# Patient Record
Sex: Female | Born: 1980 | Hispanic: Yes | Marital: Married | Smoking: Never smoker
Health system: Southern US, Community
[De-identification: ages and names within clinical notes are randomized; demographics above are authoritative.]

## PROBLEM LIST (undated history)

## (undated) DIAGNOSIS — I10 Essential (primary) hypertension: Secondary | ICD-10-CM

## (undated) DIAGNOSIS — F329 Major depressive disorder, single episode, unspecified: Secondary | ICD-10-CM

## (undated) DIAGNOSIS — F32A Depression, unspecified: Secondary | ICD-10-CM

## (undated) DIAGNOSIS — M329 Systemic lupus erythematosus, unspecified: Secondary | ICD-10-CM

## (undated) HISTORY — DX: Depression, unspecified: F32.A

## (undated) HISTORY — DX: Systemic lupus erythematosus, unspecified: M32.9

## (undated) HISTORY — DX: Major depressive disorder, single episode, unspecified: F32.9

---

## 2002-03-24 LAB — US OB DETAIL + 14 WK

## 2004-11-01 ENCOUNTER — Emergency Department (HOSPITAL_COMMUNITY): Admission: EM | Admit: 2004-11-01 | Discharge: 2004-11-01 | Payer: Self-pay | Admitting: Emergency Medicine

## 2005-04-29 ENCOUNTER — Ambulatory Visit (HOSPITAL_COMMUNITY): Admission: RE | Admit: 2005-04-29 | Discharge: 2005-04-29 | Payer: Self-pay | Admitting: Gastroenterology

## 2005-05-04 HISTORY — PX: CHOLECYSTECTOMY: SHX55

## 2005-06-03 ENCOUNTER — Ambulatory Visit (HOSPITAL_COMMUNITY): Admission: RE | Admit: 2005-06-03 | Discharge: 2005-06-03 | Payer: Self-pay | Admitting: Surgery

## 2010-05-24 ENCOUNTER — Encounter: Payer: Self-pay | Admitting: Gastroenterology

## 2011-10-07 ENCOUNTER — Ambulatory Visit (INDEPENDENT_AMBULATORY_CARE_PROVIDER_SITE_OTHER): Payer: Self-pay | Admitting: Internal Medicine

## 2011-10-07 ENCOUNTER — Encounter: Payer: Self-pay | Admitting: Internal Medicine

## 2011-10-07 VITALS — BP 122/80 | HR 74 | Temp 98.5°F | Ht 60.0 in | Wt 126.0 lb

## 2011-10-07 DIAGNOSIS — B9689 Other specified bacterial agents as the cause of diseases classified elsewhere: Secondary | ICD-10-CM

## 2011-10-07 DIAGNOSIS — A499 Bacterial infection, unspecified: Secondary | ICD-10-CM

## 2011-10-07 DIAGNOSIS — N760 Acute vaginitis: Secondary | ICD-10-CM | POA: Insufficient documentation

## 2011-10-07 DIAGNOSIS — F329 Major depressive disorder, single episode, unspecified: Secondary | ICD-10-CM

## 2011-10-07 MED ORDER — METRONIDAZOLE 500 MG PO TABS: 500.0000 mg | ORAL_TABLET | Freq: Two times a day (BID) | ORAL | Status: AC

## 2011-10-07 NOTE — Assessment & Plan Note (Signed)
Was treated 2 years ago when husband deported Severe stress with his infidelity but only occ down days and not anhedonic Rx not indicated at present

## 2011-10-07 NOTE — Progress Notes (Signed)
  Subjective:    Patient ID: Jenny Wagner, female    DOB: May 24, 1980, 31 y.o.   MRN: [id number]  HPI Establishing here  No gynecologist  Has some recurrence of yellowish vaginal discharge Was treated with antibiotic about 1 month ago and it helped Had pelvic exam and was tested for STDs Now having recurrence of symptoms Gets the symptoms before her periods Periods have been normal--no excessive bleeding  Monogamous with husband but he has been unfaithful Always used condoms other than when trying to conceive No STD in past Screened for bacterial vaginosis (sounds like) and treated in pregnancy No dyspareunia  Depression in the past Does have periods of feeling down or sad--- 1 day per week only though Some marital problems which are bad---he did have affair Not anhedonic Was treated with meds about 2 years ago---husband was deported and she had stress then  No current outpatient prescriptions on file prior to visit.    No Known Allergies  Past Medical History  Diagnosis Date  . Depression     Past Surgical History  Procedure Date  . Cholecystectomy 2007    Family History  Problem Relation Age of Onset  . Hypertension Other   . Diabetes Other     History   Social History  . Marital Status: Married    Spouse Name: N/A    Number of Children: 2  . Years of Education: N/A   Occupational History  . Stay at home mom    Social History Main Topics  . Smoking status: Never Smoker   . Smokeless tobacco: Never Used  . Alcohol Use: No  . Drug Use: No  . Sexually Active: Not on file   Other Topics Concern  . Not on file   Social History Narrative  . No narrative on file   Review of Systems  Constitutional: Positive for unexpected weight change. Negative for fatigue.       Has lost weight with stress Wears seat belt  Respiratory: Negative for cough, chest tightness and shortness of breath.   Cardiovascular: Negative for chest pain, palpitations and leg  swelling.  Gastrointestinal: Negative for nausea, vomiting, abdominal pain, blood in stool and anal bleeding.       Rectal urgency with stress---gets loose stools  Musculoskeletal: Negative for back pain and arthralgias.  Neurological: Negative for dizziness, syncope, light-headedness and headaches.  Psychiatric/Behavioral: Positive for dysphoric mood. Negative for suicidal ideas. The patient is not nervous/anxious.        Objective:   Physical Exam  Constitutional: She appears well-developed and well-nourished. No distress.  HENT:  Mouth/Throat: Oropharynx is clear and moist. No oropharyngeal exudate.  Neck: Normal range of motion. Neck supple. No thyromegaly present.  Cardiovascular: Normal rate, regular rhythm, normal heart sounds and intact distal pulses.  Exam reveals no gallop.   No murmur heard. Pulmonary/Chest: Effort normal and breath sounds normal. No respiratory distress. She has no wheezes. She has no rales.  Abdominal: Soft. There is no tenderness.  Musculoskeletal: She exhibits no edema and no tenderness.  Lymphadenopathy:    She has no cervical adenopathy.  Psychiatric: She has a normal mood and affect. Her behavior is normal. Thought content normal.          Assessment & Plan:

## 2011-10-07 NOTE — Patient Instructions (Signed)
Please try a probiotic daily to prevent further problems Please get last 3 years of records from [facility] in [place]

## 2011-10-07 NOTE — Assessment & Plan Note (Signed)
Has fairly classic symptoms Treated last month but recurred Had STD testing then---will get those records Discussed probiotics Will try course of metronidazole again

## 2011-12-02 ENCOUNTER — Ambulatory Visit (INDEPENDENT_AMBULATORY_CARE_PROVIDER_SITE_OTHER): Payer: Self-pay | Admitting: Internal Medicine

## 2011-12-02 ENCOUNTER — Encounter: Payer: Self-pay | Admitting: Internal Medicine

## 2011-12-02 VITALS — BP 118/62 | HR 94 | Temp 98.4°F | Ht 60.0 in | Wt 118.8 lb

## 2011-12-02 DIAGNOSIS — S239XXA Sprain of unspecified parts of thorax, initial encounter: Secondary | ICD-10-CM

## 2011-12-02 DIAGNOSIS — S29019A Strain of muscle and tendon of unspecified wall of thorax, initial encounter: Secondary | ICD-10-CM | POA: Insufficient documentation

## 2011-12-02 DIAGNOSIS — F439 Reaction to severe stress, unspecified: Secondary | ICD-10-CM | POA: Insufficient documentation

## 2011-12-02 DIAGNOSIS — F430 Acute stress reaction: Secondary | ICD-10-CM

## 2011-12-02 NOTE — Assessment & Plan Note (Signed)
Ongoing seperation Having IBS type symptoms---despite weight loss, nothing to suggest IBD Counseled about this

## 2011-12-02 NOTE — Progress Notes (Signed)
  Subjective:    Patient ID: Jenny Wagner, female    DOB: [birth year], 31 y.o.   MRN: [id number]  HPI Having pain in center of back---both sides of spine Feels like "a bruise" Goes back 11 months--since childbirth Was evaluated in past---told due to not exercising Pain radiates to sides and occ downward Fairly constant but more at night No arm or leg weakness Hasn't tried any meds Hasn't tried heat or any local Rx  Stress now--going through seperation Notes that she has to run to bathroom to move bowels after eating.  Very soft but no blood  No current outpatient prescriptions on file prior to visit.    No Known Allergies  Past Medical History  Diagnosis Date  . Depression     Past Surgical History  Procedure Date  . Cholecystectomy 2007    Family History  Problem Relation Age of Onset  . Hypertension Other   . Diabetes Other     History   Social History  . Marital Status: Married    Spouse Name: N/A    Number of Children: 2  . Years of Education: N/A   Occupational History  . Stay at home mom    Social History Main Topics  . Smoking status: Never Smoker   . Smokeless tobacco: Never Used  . Alcohol Use: No  . Drug Use: No  . Sexually Active: Not on file   Other Topics Concern  . Not on file   Social History Narrative  . No narrative on file   Review of Systems Vaginal symptoms are better Has lost some weight--she wasn't trying    Objective:   Physical Exam  Constitutional: She appears well-developed and well-nourished. No distress.  Neck: Normal range of motion. Neck supple.  Pulmonary/Chest: Effort normal and breath sounds normal. No respiratory distress. She has no wheezes. She has no rales.  Musculoskeletal: Normal range of motion.       Mild tenderness in right paraspinal muscles ~T10 No spine tenderness  Neurological:       No weakness in UE  Psychiatric: She has a normal mood and affect. Her behavior is normal. Thought content normal.            Assessment & Plan:

## 2011-12-02 NOTE — Patient Instructions (Signed)
Please try naproxen 220mg  1-2 tabs at bedtime Start a core strengthening regimen

## 2011-12-02 NOTE — Assessment & Plan Note (Signed)
Really just seems to be muscular Discussed aleve at bedtime Exercises---esp for core

## 2012-07-27 ENCOUNTER — Ambulatory Visit (INDEPENDENT_AMBULATORY_CARE_PROVIDER_SITE_OTHER): Payer: Self-pay | Admitting: Internal Medicine

## 2012-07-27 ENCOUNTER — Encounter: Payer: Self-pay | Admitting: Internal Medicine

## 2012-07-27 VITALS — BP 120/80 | HR 63 | Temp 98.2°F | Wt 124.0 lb

## 2012-07-27 DIAGNOSIS — L299 Pruritus, unspecified: Secondary | ICD-10-CM

## 2012-07-27 NOTE — Assessment & Plan Note (Signed)
No follicular infection Not worrisome Discussed trying cream or powder Consider dermatologist

## 2012-07-27 NOTE — Progress Notes (Signed)
  Subjective:    Patient ID: Jenny Wagner, female    DOB: 11-26-1980, 32 y.o.   MRN: [id number]  HPI Has noticed some pain and itching in breasts Tried switching bras and deodorant Now has lump on right breast ---on surface No discharge No rash  No FH of breast cancer No new laundry detergents Hasn't tried any topical rx  No current outpatient prescriptions on file prior to visit.   No current facility-administered medications on file prior to visit.    No Known Allergies  Past Medical History  Diagnosis Date  . Depression     Past Surgical History  Procedure Laterality Date  . Cholecystectomy  2007    Family History  Problem Relation Age of Onset  . Hypertension Other   . Diabetes Other     History   Social History  . Marital Status: Married    Spouse Name: N/A    Number of Children: 2  . Years of Education: N/A   Occupational History  . Stay at home mom    Social History Main Topics  . Smoking status: Never Smoker   . Smokeless tobacco: Never Used  . Alcohol Use: No  . Drug Use: No  . Sexually Active: Not on file   Other Topics Concern  . Not on file   Social History Narrative  . No narrative on file   Review of Systems No fevers Hasn't felt sick    Objective:   Physical Exam  Genitourinary:  Slight cystic changes in breasts No worrisome lesions  Lymphadenopathy:    She has no axillary adenopathy.  Skin:  Follicular prominence on breasts 2 small papules on right breast--- at hair follicles it looks like          Assessment & Plan:

## 2012-09-28 ENCOUNTER — Ambulatory Visit (INDEPENDENT_AMBULATORY_CARE_PROVIDER_SITE_OTHER): Payer: Self-pay | Admitting: Family Medicine

## 2012-09-28 ENCOUNTER — Telehealth: Payer: Self-pay | Admitting: Internal Medicine

## 2012-09-28 ENCOUNTER — Encounter: Payer: Self-pay | Admitting: Family Medicine

## 2012-09-28 VITALS — BP 100/70 | HR 80 | Temp 98.3°F | Wt 125.2 lb

## 2012-09-28 DIAGNOSIS — J029 Acute pharyngitis, unspecified: Secondary | ICD-10-CM

## 2012-09-28 DIAGNOSIS — N898 Other specified noninflammatory disorders of vagina: Secondary | ICD-10-CM | POA: Insufficient documentation

## 2012-09-28 LAB — POCT WET PREP (WET MOUNT)

## 2012-09-28 MED ORDER — FLUCONAZOLE 150 MG PO TABS: 150.0000 mg | ORAL_TABLET | Freq: Once | ORAL | Status: DC

## 2012-09-28 NOTE — Telephone Encounter (Signed)
Patient Information:  Caller Name: Akirah  Phone: [phone]  Patient: Jenny Wagner, Jenny Wagner  Gender: Female  DOB: 09/17/1980  Age: 32 Years  PCP: Tillman Abide [facility])  Pregnant: No  Office Follow Up:  Does the office need to follow up with this patient?: Yes  Instructions For The Office: Appointments are full. Could office call pt back with possible options for appt today.   Symptoms  Reason For Call & Symptoms: Pt is calling for an appt for lower back pain and sore throat.  Back sx started last night/sore throat started this am. Pt is also feeling dysuria; frequency and a feeling that she is not emptying her bladder. The back pain she rates as a 6/10.  Reviewed Health History In EMR: Yes  Reviewed Medications In EMR: Yes  Reviewed Allergies In EMR: Yes  Reviewed Surgeries / Procedures: Yes  Date of Onset of Symptoms: 09/27/2012 OB / GYN:  LMP: 09/22/2012  Guideline(s) Used:  Urination Pain - Female  Disposition Per Guideline:   See Today in Office  Reason For Disposition Reached:   Painful urination AND EITHER frequency or urgency  Advice Given:  N/A  Patient Will Follow Care Advice:  YES

## 2012-09-28 NOTE — Assessment & Plan Note (Signed)
Treat as viral pharyngitis with supportive care as per instructions. Update if sxs persist.

## 2012-09-28 NOTE — Telephone Encounter (Signed)
Called pt and scheduled appt for tomorrow at 1:30

## 2012-09-28 NOTE — Assessment & Plan Note (Addendum)
Faintly positive wet prep - with rare spores.  Treat with diflucan, update if sxs persist. Pt agrees with plan.

## 2012-09-28 NOTE — Progress Notes (Addendum)
  Subjective:    Patient ID: Jenny Wagner, female    DOB: 12-Sep-1980, 32 y.o.   MRN: [id number]  HPI CC: vaginal infection?  3d h/o yeast infection sxs - light yellow discharge, mild pruritis.  Mild nausea this morning. Last yeast infection was 1 yr ago.  Last night started noticing sharp pain in right flank, then this morning started feeling sore throat. + nasal congestion.  For last month, noticing swelling in hands and feet.  Denies fevers/chills, vomiting, dysuria, urgency, frequency, vag bleeding, hematuria, diarrhea, constipation.  No abd pain.  No new rashes.  H/o seasonal allergies. Sister in law recently sick. No smokers at home. No h/o asthma.  LMP 09/22/2012 Denies chances of pregnancy. Currently sexually active with 1 partner in the last year.  Past Medical History  Diagnosis Date  . Depression      Review of Systems Per HPI    Objective:   Physical Exam  Nursing note and vitals reviewed. Constitutional: She appears well-developed and well-nourished. No distress.  HENT:  Head: Normocephalic and atraumatic.  Right Ear: Hearing, tympanic membrane, external ear and ear canal normal.  Left Ear: Hearing, tympanic membrane, external ear and ear canal normal.  Nose: No mucosal edema or rhinorrhea. Right sinus exhibits no maxillary sinus tenderness and no frontal sinus tenderness. Left sinus exhibits no maxillary sinus tenderness and no frontal sinus tenderness.  Mouth/Throat: Uvula is midline, oropharynx is clear and moist and mucous membranes are normal. No oropharyngeal exudate.  Eyes: Conjunctivae and EOM are normal. Pupils are equal, round, and reactive to light. No scleral icterus.  Neck: Normal range of motion. Neck supple.  Cardiovascular: Normal rate, regular rhythm, normal heart sounds and intact distal pulses.   No murmur heard. Pulmonary/Chest: Effort normal and breath sounds normal. No respiratory distress. She has no wheezes. She has no rales.   Abdominal: Soft. Bowel sounds are normal. She exhibits no distension and no mass. There is no hepatosplenomegaly. There is no tenderness. There is no rigidity, no rebound, no guarding, no CVA tenderness and negative Murphy's sign.  Genitourinary:  Specimen self-collected for wet prep  Musculoskeletal: She exhibits no edema.  No arm or leg swelling noted today.  Lymphadenopathy:    She has no cervical adenopathy (shotty bilat superficial cervical LAD).  Skin: Skin is warm and dry. No rash noted.       Assessment & Plan:

## 2012-09-28 NOTE — Patient Instructions (Signed)
Wet prep overall ok, I want to treat you for yeast infection given symptoms.  Let us know if symptoms persist despite this. For sore throat -  Push fluids and plenty of rest. May use ibuprofen for throat inflammation. Salt water gargles. Suck on cold things like popsicles or warm things like herbal teas (whichever soothes the throat better). Return if fever >101.5, worsening pain, or trouble opening/closing mouth, or hoarse voice. Good to see you today, call clinic with questions.

## 2012-09-28 NOTE — Telephone Encounter (Signed)
Okay Will evaluate then 

## 2012-09-29 ENCOUNTER — Ambulatory Visit: Payer: Self-pay | Admitting: Internal Medicine

## 2012-10-07 ENCOUNTER — Encounter: Payer: Self-pay | Admitting: Internal Medicine

## 2012-10-07 DIAGNOSIS — Z0289 Encounter for other administrative examinations: Secondary | ICD-10-CM

## 2013-01-31 ENCOUNTER — Telehealth: Payer: Self-pay | Admitting: Internal Medicine

## 2013-01-31 NOTE — Telephone Encounter (Signed)
Patient Information:  Caller Name: Rehema  Phone: [phone]  Patient: Jenny, Wagner  Gender: Female  DOB: 22-Mar-1981  Age: 32 Years  PCP: Tillman Abide [facility])  Pregnant: No  Office Follow Up:  Does the office need to follow up with this patient?: No  Instructions For The Office: N/A  RN Note:  Reports hearing is unaffected. Too late for appointment for 01/31/13; Advised to see MD 02/01/13. Reported office staff scheduled appointment for 02/01/13 at 1100 with Dr Alphonsus Sias.  Symptoms  Reason For Call & Symptoms: New onset of "bubbly noise" (congestion) in left ear today without pain.  Nasal congestion or "flu like sympotms" began 01/28/13 with worsening nasal congestion since 01/30/13.  Reviewed Health History In EMR: Yes  Reviewed Medications In EMR: Yes  Reviewed Allergies In EMR: Yes  Reviewed Surgeries / Procedures: Yes  Date of Onset of Symptoms: 01/31/2013 OB / GYN:  LMP: 01/18/2013  Guideline(s) Used:  Ear - Congestion  Disposition Per Guideline:   See Today in Office  Reason For Disposition Reached:   Patient wants to be seen  Advice Given:  Reassurance:  Eustacian tube: There is a small collapsible tube that runs between the middle ear and the nose. Normally, it permits tiny amounts of air to move in and out of the middle ear. When the tube gets blocked, air or fluid can build up behind the ear drum (tympanic membrane). This causes the symptoms of ear congestion.  Here is some care advice that should help.  Causes  Upper respiratory infections (colds) and nasal allergies (hay fever) can block the eustachian tube.  Blowing the nose too hard can also push air and fluid into the eustachian tube.  Air travelers can get ear congestion. This happens because of the changes in air pressure as the air plane takes off and lands.  Treatment - Chewing and Swallowing:   Try chewing gum.  You can also try swallowing water while pinching your nostrils closed. The  reason this works is that it creates a small vacuum in the nose. This helps the eustachian tube to open up.  Treatment - Decongestant Nasal Spray:  If chewing or swallowing doesn't help after 1 or 2 hours, you can try using an over-the-counter (OTC) nasal decongestant drops. You can use the nose drops twice a day.  Oxymetazoline Nasal Drops (e.g., Afrin): Available OTC. Clean out the nose before using. Spray each nostril once, wait one minute for it to absorb, and then spray a second time.  Phenylephrine Nasal Drops (e.g., Neo-Synephrine): Available OTC. Clean out the nose before using. Spray each nostril once, wait one minute for it to absorb, and then spray a second time.  Before taking any medicine, read all the instructions on the package.  Caution - Nasal Decongestants:  Do not take these medications if you are pregnant.  Do not use these medications for more than 3 days (Reason: rebound nasal congestion).  Expected Course:   The symptoms usually get better within 2 days (48 hours) with treatment.  Call Back If:   Ear congestion lasts over 48 hours  Ear pain or fever occurs  You become worse.  RN Overrode Recommendation:  Patient Already Has Appt, Document Patient  Office scheduled appointment for 1100 02/01/13.

## 2013-02-01 ENCOUNTER — Ambulatory Visit: Payer: Self-pay | Admitting: Internal Medicine

## 2013-02-01 NOTE — Telephone Encounter (Signed)
Will see then. 

## 2013-02-20 ENCOUNTER — Ambulatory Visit (INDEPENDENT_AMBULATORY_CARE_PROVIDER_SITE_OTHER): Payer: Self-pay | Admitting: Family Medicine

## 2013-02-20 ENCOUNTER — Telehealth: Payer: Self-pay | Admitting: Internal Medicine

## 2013-02-20 VITALS — BP 120/80 | HR 67 | Ht 60.0 in | Wt 130.0 lb

## 2013-02-20 DIAGNOSIS — S39012A Strain of muscle, fascia and tendon of lower back, initial encounter: Secondary | ICD-10-CM

## 2013-02-20 DIAGNOSIS — S335XXA Sprain of ligaments of lumbar spine, initial encounter: Secondary | ICD-10-CM

## 2013-02-20 MED ORDER — TRAMADOL HCL 50 MG PO TABS: 50.0000 mg | ORAL_TABLET | Freq: Every evening | ORAL | Status: DC | PRN

## 2013-02-20 MED ORDER — MELOXICAM 15 MG PO TABS: 15.0000 mg | ORAL_TABLET | Freq: Every day | ORAL | Status: DC

## 2013-02-20 NOTE — Telephone Encounter (Signed)
Patient Information:  Caller Name: Charlestine  Phone: [phone]  Patient: Jenny Wagner, Jenny Wagner  Gender: Female  DOB: [birth year]  Age: 32 Years  PCP: Tillman Abide [facility])  Pregnant: No  Office Follow Up:  Does the office need to follow up with this patient?: No  Instructions For The Office: N/A  RN Note:  Condoms. Left lower back pain rated 10/10, if moves.  Unable to stand up straight.  Denies numbness or tingling.  No appointments remain at [facility], [place] or [facility].  Scheduled for 1600 02/20/13 at [facility] with Dr Katrinka Blazing.   Symptoms  Reason For Call & Symptoms: Left lower back pain while twisting to remove toddler from tub at 1400. Having difficulty moving or standing up straight due to pain.    Reviewed Health History In EMR: Yes  Reviewed Medications In EMR: Yes  Reviewed Allergies In EMR: Yes  Reviewed Surgeries / Procedures: Yes  Date of Onset of Symptoms: 02/20/2013 OB / GYN:  LMP: 02/09/2013  Guideline(s) Used:  Back Pain  Disposition Per Guideline:   Go to Office Now  Reason For Disposition Reached:   Severe back pain  Advice Given:  Reassurance:  Twisting or heavy lifting can cause back pain.  With treatment, the pain most often goes away in 1-2 weeks.  Cold or Heat:  Cold Pack: For pain or swelling, use a cold pack or ice wrapped in a wet cloth. Put it on the sore area for 20 minutes. Repeat 4 times on the first day, then as needed.  Heat Pack: If pain lasts over 2 days, apply heat to the sore area. Use a heat pack, heating pad, or warm wet washcloth. Do this for 10 minutes, then as needed. For widespread stiffness, take a hot bath or hot shower instead. Move the sore area under the warm water.  Sleep:  Sleep on your side with a pillow between your knees. If you sleep on your back, put a pillow under your knees.  Avoid sleeping on your stomach.  Your mattress should be firm. Avoid waterbeds.  Activity  Keep doing your day-to-day  activities if it is not too painful. Staying active is better than resting.  Avoid anything that makes your pain worse. Avoid heavy lifting, twisting, and too much exercise until your back heals.  You do not need to stay in bed.  Pain Medicines:  Use the lowest amount of medicine that makes your pain feel better.  Ibuprofen (e.g., Motrin, Advil):  Take 400 mg (two 200 mg pills) by mouth every 6 hours.  Another choice is to take 600 mg (three 200 mg pills) by mouth every 8 hours.  The most you should take each day is 1,200 mg (six 200 mg pills), unless your doctor has told you to take more.  Call Back If:  Numbness or weakness occur  Bowel/bladder problems occur  Pain lasts for more than 2 weeks  You become worse.  Good Body Mechanics:  Lifting: Stand close to the object to be lifted. Keep your back straight and lift by bending your legs. Ask for lifting help if needed.  Call Back If:  You become worse.  Patient Will Follow Care Advice:  YES

## 2013-02-20 NOTE — Progress Notes (Signed)
  CC: Low back pain  HPI: Patient is a very pleasant 32 year old female who unfortunately started having pain last night after trying to lift her collar out of the tub. Patient states she had acute pain immediately over the left lower back pain. Patient states it is so that the shoes unable to stand up straight. She denies any radiation of pain, denies any numbness or tingling or any weakness in the lower extremity. Patient states the pain though is 10 out of 10. Patient has not had any injury like this previously. Patient did have thoracic back pain but states that this is a different type of pain. Patient describes the pain as more of a dull aching sensation worse with movement.   Past medical, surgical, family and social history reviewed. Medications reviewed all in the electronic medical record.   Review of Systems: No headache, visual changes, nausea, vomiting, diarrhea, constipation, dizziness, abdominal pain, skin rash, fevers, chills, night sweats, weight loss, swollen lymph nodes, body aches, joint swelling, muscle aches, chest pain, shortness of breath, mood changes.   Objective:    Blood pressure 120/80, pulse 67, height 5' (1.524 m), weight 130 lb (58.968 kg), SpO2 99.00%.   General: No apparent distress alert and oriented x3 mood and affect normal, dressed appropriately.  HEENT: Pupils equal, extraocular movements intact Respiratory: Patient's speak in full sentences and does not appear short of breath Cardiovascular: No lower extremity edema, non tender, no erythema Skin: Warm dry intact with no signs of infection or rash on extremities or on axial skeleton. Abdomen: Soft nontender Neuro: Cranial nerves II through XII are intact, neurovascularly intact in all extremities with 2+ DTRs and 2+ pulses. Lymph: No lymphadenopathy of posterior or anterior cervical chain or axillae bilaterally.  Gait normal with good balance and coordination.  MSK: Non tender with full range of motion and  good stability and symmetric strength and tone of shoulders, elbows, wrist, hip, knee and ankles bilaterally.  Back Exam:  Inspection: Unremarkable  Motion: Flexion 15 deg, Extension 45 deg, Side Bending to 45 deg bilaterally,  Rotation to 45 deg bilaterally  SLR laying: Negative  XSLR laying: Negative  Palpable tenderness: left lumbar paraspinal. FABER: mild positive.  Sensory change: Gross sensation intact to all lumbar and sacral dermatomes.  Reflexes: 2+ at both patellar tendons, 2+ at achilles tendons, Babinski's downgoing.  Strength at foot  Plantar-flexion: 5/5 Dorsi-flexion: 5/5 Eversion: 5/5 Inversion: 5/5  Leg strength  Quad: 5/5 Hamstring: 5/5 Hip flexor: 5/5 Hip abductors: 5/5  Gait careful. .   Impression and Recommendations:     This case required medical decision making of moderate complexity.

## 2013-02-20 NOTE — Patient Instructions (Addendum)
Very nice to meet you Try exercises starting in 48 hours.  Ice 20 minutes 2 times a day.  Meloxicam daily for 7 days  Tramadol at night if needed Come back in  1 week to make sure you are better.   Sacroiliac Joint Mobilization and Rehab 1. Work on pretzel stretching, shoulder back and leg draped in front. 3-5 sets, 30 sec.. 2. hip abductor rotations. standing, hip flexion and rotation outward then inward. 3 sets, 15 reps. when can do comfortably, add ankle weights starting at 2 pounds.  3. cross over stretching - shoulder back to ground, same side leg crossover. 3-5 sets for 30 min..  4. rolling up and back knees to chest and rocking. 5. sacral tilt - 5 sets, hold for 5-10 seconds

## 2013-02-21 ENCOUNTER — Encounter: Payer: Self-pay | Admitting: Family Medicine

## 2013-02-21 DIAGNOSIS — S39012A Strain of muscle, fascia and tendon of lower back, initial encounter: Secondary | ICD-10-CM | POA: Insufficient documentation

## 2013-02-21 MED ORDER — KETOROLAC TROMETHAMINE 60 MG/2ML IM SOLN
60.0000 mg | Freq: Once | INTRAMUSCULAR | Status: AC
  Administered 2013-02-20: 60 mg via INTRAMUSCULAR

## 2013-02-21 MED ORDER — METHYLPREDNISOLONE ACETATE 80 MG/ML IJ SUSP
80.0000 mg | Freq: Once | INTRAMUSCULAR | Status: AC
  Administered 2013-02-21: 80 mg via INTRAMUSCULAR

## 2013-02-21 NOTE — Addendum Note (Signed)
Addended by: Edwena Felty T on: 02/21/2013 07:58 AM   Modules accepted: Orders

## 2013-02-21 NOTE — Assessment & Plan Note (Signed)
Diagnosis, prognosis and rehabilitation discussed. No signs of radiculopathy with negative straight leg test. Handout given for home exercises Toradol and Depo-Medrol given today. Meloxicam and tramadol given for home use. Discussed icing protocol Patient will return in one week for further evaluation.

## 2013-03-01 ENCOUNTER — Ambulatory Visit: Payer: Self-pay | Admitting: Family Medicine

## 2013-03-01 DIAGNOSIS — Z0289 Encounter for other administrative examinations: Secondary | ICD-10-CM

## 2013-06-06 ENCOUNTER — Encounter: Payer: Self-pay | Admitting: Family Medicine

## 2013-06-06 ENCOUNTER — Ambulatory Visit (INDEPENDENT_AMBULATORY_CARE_PROVIDER_SITE_OTHER): Payer: Self-pay | Admitting: Family Medicine

## 2013-06-06 VITALS — BP 122/80 | HR 94 | Temp 97.7°F | Wt 130.2 lb

## 2013-06-06 DIAGNOSIS — J069 Acute upper respiratory infection, unspecified: Secondary | ICD-10-CM | POA: Insufficient documentation

## 2013-06-06 DIAGNOSIS — B9789 Other viral agents as the cause of diseases classified elsewhere: Principal | ICD-10-CM

## 2013-06-06 LAB — POCT INFLUENZA A/B
Influenza A, POC: NEGATIVE
Influenza B, POC: NEGATIVE

## 2013-06-06 MED ORDER — GUAIFENESIN-CODEINE 100-10 MG/5ML PO SYRP: 5.0000 mL | ORAL_SOLUTION | Freq: Two times a day (BID) | ORAL | Status: DC | PRN

## 2013-06-06 NOTE — Assessment & Plan Note (Addendum)
Given body aches check flu swab - negative Lungs clear.  Anticipate viral given short duration. Treat supportively as per pt instructions cheratussin cough syrup for cough at night time. Update if sxs deteriorate or fail to improve as expected. Mild R eye conjunctivitis anticipate viral - treat with cool compresses and lubricating eye drops.

## 2013-06-06 NOTE — Addendum Note (Signed)
Addended by: Royann Shivers A on: 06/06/2013 03:36 PM   Modules accepted: Orders

## 2013-06-06 NOTE — Progress Notes (Signed)
Pre-visit discussion using our clinic review tool. No additional management support is needed unless otherwise documented below in the visit note.  

## 2013-06-06 NOTE — Progress Notes (Addendum)
   Subjective:    Patient ID: Jenny Wagner, female    DOB: 12/08/1980, 33 y.o.   MRN: [id number]  HPI CC: chest congestion   3d h/o body aches, dry cough, chest congestion.  Started with rhinorrhea.  Feverish last night.  Aches in jaw.  2-3 wks ago with redness in right eye.  +PNdrianage and HA.  Cough worse at night time.  No abd pain, ear pain, nausea, ST, wheezing.   + sick contacts recently as well as at church. Did not receive flu shot. No h/o asthma. Hasn't tried anything for this yet.  Past Medical History  Diagnosis Date  . Depression      Review of Systems Per HPI    Objective:   Physical Exam  Nursing note and vitals reviewed. Constitutional: She appears well-developed and well-nourished. No distress.  HENT:  Head: Normocephalic and atraumatic.  Right Ear: Hearing, tympanic membrane, external ear and ear canal normal.  Left Ear: Hearing, tympanic membrane, external ear and ear canal normal.  Nose: Mucosal edema present. No rhinorrhea. Right sinus exhibits no maxillary sinus tenderness and no frontal sinus tenderness. Left sinus exhibits no maxillary sinus tenderness and no frontal sinus tenderness.  Mouth/Throat: Uvula is midline and mucous membranes are normal. Posterior oropharyngeal erythema (mild) present. No oropharyngeal exudate, posterior oropharyngeal edema or tonsillar abscesses.  Eyes: Conjunctivae and EOM are normal. Pupils are equal, round, and reactive to light. No scleral icterus.  Slight R bulbar conjunctival injection with limbic sparing, EOMI without pain.  Neck: Normal range of motion. Neck supple.  Cardiovascular: Normal rate, regular rhythm, normal heart sounds and intact distal pulses.   No murmur heard. Pulmonary/Chest: Effort normal and breath sounds normal. No respiratory distress. She has no wheezes. She has no rales.  Lymphadenopathy:    She has no cervical adenopathy.  Skin: Skin is warm and dry. No rash noted.       Assessment & Plan:

## 2013-06-06 NOTE — Patient Instructions (Signed)
You have a viral upper respiratory infection. Antibiotics are not needed for this.  Viral infections usually take 7-10 days to resolve.  The cough can last a few weeks to go away. Use medication as prescribed: cheratussin as needed Push fluids and plenty of rest. Please return if you are not improving as expected, or let us know if you have high fevers (>101.5) or worsening productive cough. Call clinic with questions.  Good to see you today.

## 2013-12-06 ENCOUNTER — Encounter: Payer: Self-pay | Admitting: Internal Medicine

## 2013-12-06 ENCOUNTER — Ambulatory Visit (INDEPENDENT_AMBULATORY_CARE_PROVIDER_SITE_OTHER): Payer: Self-pay | Admitting: Internal Medicine

## 2013-12-06 VITALS — BP 110/70 | HR 73 | Temp 98.5°F | Wt 131.0 lb

## 2013-12-06 DIAGNOSIS — J029 Acute pharyngitis, unspecified: Secondary | ICD-10-CM

## 2013-12-06 MED ORDER — KETOCONAZOLE 2 % EX SHAM: 1.0000 "application " | MEDICATED_SHAMPOO | CUTANEOUS | Status: DC

## 2013-12-06 NOTE — Progress Notes (Signed)
Pre visit review using our clinic review tool, if applicable. No additional management support is needed unless otherwise documented below in the visit note. 

## 2013-12-06 NOTE — Progress Notes (Signed)
   Subjective:    Patient ID: Jenny Wagner, female    DOB: 08-Nov-1980, 33 y.o.   MRN: [id number]  HPI Having sore throat-- started ~4 days ago Some pain in ears and then radiates down to throat Pain with swallowing---but still able to eat  No fever Some cough---mostly at night. Feels PND Some headache--temporal Slight SOB---relates it to swollen throat  Used some promethazine DM last night---helped her sleep Son is sick now also  No current outpatient prescriptions on file prior to visit.   No current facility-administered medications on file prior to visit.    No Known Allergies  Past Medical History  Diagnosis Date  . Depression     Past Surgical History  Procedure Laterality Date  . Cholecystectomy  2007    Family History  Problem Relation Age of Onset  . Hypertension Other   . Diabetes Other     History   Social History  . Marital Status: Married    Spouse Name: N/A    Number of Children: 2  . Years of Education: N/A   Occupational History  . Stay at home mom    Social History Main Topics  . Smoking status: Never Smoker   . Smokeless tobacco: Never Used  . Alcohol Use: No  . Drug Use: No  . Sexual Activity: Not on file   Other Topics Concern  . Not on file   Social History Narrative  . No narrative on file   Review of Systems No rash Mild nausea due to throat--like when brushing teeth No vomiting or diarrhea    Objective:   Physical Exam  Constitutional: She appears well-developed and well-nourished. No distress.  HENT:  No sinus tenderness Pharynx slightly red--but no tonsillar enlargement TMs normal  Neck: Normal range of motion. Neck supple.  Slight non tender anterior cervical nodes  Pulmonary/Chest: Effort normal and breath sounds normal. No respiratory distress. She has no wheezes. She has no rales.  Skin: No rash noted.          Assessment & Plan:

## 2013-12-06 NOTE — Assessment & Plan Note (Signed)
Clearly seems to be viral Discussed symptomatic Rx If worsens next week, she should call (?secondary sinusitis possible)

## 2013-12-11 ENCOUNTER — Telehealth: Payer: Self-pay | Admitting: Internal Medicine

## 2013-12-11 MED ORDER — AMOXICILLIN 500 MG PO TABS: 1000.0000 mg | ORAL_TABLET | Freq: Two times a day (BID) | ORAL | Status: DC

## 2013-12-11 NOTE — Telephone Encounter (Signed)
Let her know that I sent a prescription for the antibiotic

## 2013-12-11 NOTE — Telephone Encounter (Signed)
Pt not feeling any better. Has really deep cough with yellow/green phlegm, runny nose and both ears are hurting. Really bad sore throat. [facility].  [phone]

## 2013-12-11 NOTE — Telephone Encounter (Signed)
Spoke with patient and advised results   

## 2013-12-13 ENCOUNTER — Encounter: Payer: Self-pay | Admitting: Internal Medicine

## 2013-12-13 ENCOUNTER — Ambulatory Visit (INDEPENDENT_AMBULATORY_CARE_PROVIDER_SITE_OTHER): Payer: Self-pay | Admitting: Internal Medicine

## 2013-12-13 ENCOUNTER — Ambulatory Visit (INDEPENDENT_AMBULATORY_CARE_PROVIDER_SITE_OTHER)
Admission: RE | Admit: 2013-12-13 | Discharge: 2013-12-13 | Disposition: A | Discharge status: Home / Self Care | Payer: Self-pay | Source: Ambulatory Visit | Attending: Internal Medicine | Admitting: Internal Medicine

## 2013-12-13 VITALS — BP 100/70 | HR 73 | Temp 98.1°F | Resp 12 | Wt 132.0 lb

## 2013-12-13 DIAGNOSIS — R059 Cough, unspecified: Secondary | ICD-10-CM | POA: Insufficient documentation

## 2013-12-13 DIAGNOSIS — R05 Cough: Secondary | ICD-10-CM

## 2013-12-13 DIAGNOSIS — R053 Chronic cough: Secondary | ICD-10-CM | POA: Insufficient documentation

## 2013-12-13 MED ORDER — LEVOFLOXACIN 500 MG PO TABS: 500.0000 mg | ORAL_TABLET | Freq: Every day | ORAL | Status: DC

## 2013-12-13 NOTE — Progress Notes (Signed)
   Subjective:    Patient ID: Jenny Wagner, female    DOB: 1980-06-09, 33 y.o.   MRN: [id number]  HPI Has been getting worse Started the amoxicillin 2 days ago but no better Having pain in back with cough-- and pleuritic left back pain Purulent sputum still Feels SOB when lying down Fever yesterday  nyquil last night--helped her sleep  Current Outpatient Prescriptions on File Prior to Visit  Medication Sig Dispense Refill  . amoxicillin (AMOXIL) 500 MG tablet Take 2 tablets (1,000 mg total) by mouth 2 (two) times daily.  40 tablet  0  . ketoconazole (NIZORAL) 2 % shampoo Apply 1 application topically 2 (two) times a week.  120 mL  5   No current facility-administered medications on file prior to visit.    No Known Allergies  Past Medical History  Diagnosis Date  . Depression     Past Surgical History  Procedure Laterality Date  . Cholecystectomy  2007    Family History  Problem Relation Age of Onset  . Hypertension Other   . Diabetes Other     History   Social History  . Marital Status: Married    Spouse Name: N/A    Number of Children: 2  . Years of Education: N/A   Occupational History  . Stay at home mom    Social History Main Topics  . Smoking status: Never Smoker   . Smokeless tobacco: Never Used  . Alcohol Use: No  . Drug Use: No  . Sexual Activity: Not on file   Other Topics Concern  . Not on file   Social History Narrative  . No narrative on file   Review of Systems No abdominal pain No rash No vomiting or diarrhea Appetite is okay     Objective:   Physical Exam  Constitutional: She appears well-developed and well-nourished. No distress.  Persistent coarse cough  HENT:  Mouth/Throat: Oropharynx is clear and moist. No oropharyngeal exudate.  No sinus tenderness TMs normal  Neck: Normal range of motion. Neck supple. No thyromegaly present.  Pulmonary/Chest: Effort normal and breath sounds normal. No respiratory distress. She has no  wheezes. She has no rales.  Lymphadenopathy:    She has no cervical adenopathy.  Skin: No rash noted.          Assessment & Plan:

## 2013-12-13 NOTE — Progress Notes (Signed)
Pre visit review using our clinic review tool, if applicable. No additional management support is needed unless otherwise documented below in the visit note. 

## 2013-12-13 NOTE — Assessment & Plan Note (Addendum)
With purulent sputum Doesn't seem sinus CXR looks normal Will change to levaquin

## 2013-12-27 ENCOUNTER — Encounter: Payer: Self-pay | Admitting: *Deleted

## 2014-01-24 ENCOUNTER — Other Ambulatory Visit: Payer: Self-pay | Admitting: Internal Medicine

## 2014-01-24 ENCOUNTER — Ambulatory Visit (INDEPENDENT_AMBULATORY_CARE_PROVIDER_SITE_OTHER)
Admission: RE | Admit: 2014-01-24 | Discharge: 2014-01-24 | Disposition: A | Discharge status: Home / Self Care | Payer: Self-pay | Source: Ambulatory Visit | Attending: Internal Medicine | Admitting: Internal Medicine

## 2014-01-24 DIAGNOSIS — J189 Pneumonia, unspecified organism: Secondary | ICD-10-CM

## 2014-01-24 DIAGNOSIS — J181 Lobar pneumonia, unspecified organism: Principal | ICD-10-CM

## 2014-10-02 ENCOUNTER — Encounter: Payer: Self-pay | Admitting: Internal Medicine

## 2014-10-02 ENCOUNTER — Ambulatory Visit (INDEPENDENT_AMBULATORY_CARE_PROVIDER_SITE_OTHER): Payer: Self-pay | Admitting: Internal Medicine

## 2014-10-02 ENCOUNTER — Ambulatory Visit (INDEPENDENT_AMBULATORY_CARE_PROVIDER_SITE_OTHER)
Admission: RE | Admit: 2014-10-02 | Discharge: 2014-10-02 | Disposition: A | Discharge status: Home / Self Care | Payer: Self-pay | Source: Ambulatory Visit | Attending: Internal Medicine | Admitting: Internal Medicine

## 2014-10-02 VITALS — BP 118/70 | HR 80 | Temp 97.7°F | Wt 136.0 lb

## 2014-10-02 DIAGNOSIS — R05 Cough: Secondary | ICD-10-CM

## 2014-10-02 DIAGNOSIS — R059 Cough, unspecified: Secondary | ICD-10-CM

## 2014-10-02 MED ORDER — LEVOFLOXACIN 500 MG PO TABS: 500.0000 mg | ORAL_TABLET | Freq: Every day | ORAL | Status: DC

## 2014-10-02 NOTE — Assessment & Plan Note (Addendum)
Sick for about a week Feels like when she had pneumonia CXR looks okay now Will give 7 day levaquin course though due to her recurrent issues If there is an abnormality on CXR, would refer to pulmonary for further evaluation

## 2014-10-02 NOTE — Progress Notes (Signed)
Pre visit review using our clinic review tool, if applicable. No additional management support is needed unless otherwise documented below in the visit note. 

## 2014-10-02 NOTE — Progress Notes (Signed)
   Subjective:    Patient ID: Jenny Wagner, female    DOB: 1980/11/26, 34 y.o.   MRN: [id number]  HPI Here due to cough Started with sore throat and then cough started the next day Sick for a week  Fever 2 days ago Slight SOB--even just sitting down Some yellow mucus--especially dark in AM  Some ear pain--both sides Has head congestion and headache (with the fever) Feels like she did last year with the pneumonia  Tried zyrtec for the past 3-4 days Tylenol cold and flu helped her rest  No current outpatient prescriptions on file prior to visit.   No current facility-administered medications on file prior to visit.    No Known Allergies  Past Medical History  Diagnosis Date  . Depression     Past Surgical History  Procedure Laterality Date  . Cholecystectomy  2007    Family History  Problem Relation Age of Onset  . Hypertension Other   . Diabetes Other     History   Social History  . Marital Status: Married    Spouse Name: N/A  . Number of Children: 2  . Years of Education: N/A   Occupational History  . Stay at home mom    Social History Main Topics  . Smoking status: Never Smoker   . Smokeless tobacco: Never Used  . Alcohol Use: No  . Drug Use: No  . Sexual Activity: Not on file   Other Topics Concern  . Not on file   Social History Narrative   Review of Systems  No rash No vomiting or diarrhea Appetite is okay Dizziness when lying down or getting up Having some low back pain with her periods for the past 7 months or so    Objective:   Physical Exam  Constitutional: She appears well-developed and well-nourished. No distress.  HENT:  Mouth/Throat: Oropharynx is clear and moist. No oropharyngeal exudate.  No sinus tenderness TMs normal Mild nasal inflammation  Neck: Normal range of motion. Neck supple. No thyromegaly present.  Pulmonary/Chest: Effort normal and breath sounds normal. No respiratory distress. She has no wheezes. She has no  rales.  No dullness  Lymphadenopathy:    She has no cervical adenopathy.          Assessment & Plan:

## 2014-10-03 ENCOUNTER — Encounter: Payer: Self-pay | Admitting: *Deleted

## 2015-01-28 ENCOUNTER — Ambulatory Visit (INDEPENDENT_AMBULATORY_CARE_PROVIDER_SITE_OTHER): Payer: Self-pay | Admitting: Internal Medicine

## 2015-01-28 ENCOUNTER — Encounter: Payer: Self-pay | Admitting: Internal Medicine

## 2015-01-28 VITALS — BP 100/70 | HR 66 | Temp 98.4°F | Wt 139.0 lb

## 2015-01-28 DIAGNOSIS — L219 Seborrheic dermatitis, unspecified: Secondary | ICD-10-CM

## 2015-01-28 MED ORDER — HYDROCORTISONE 2.5 % EX CREA: TOPICAL_CREAM | Freq: Three times a day (TID) | CUTANEOUS | Status: DC | PRN

## 2015-01-28 NOTE — Progress Notes (Signed)
   Subjective:    Patient ID: Jenny Wagner, female    DOB: 02/28/1981, 34 y.o.   MRN: [id number]  HPI Here due to skin lesions  Having "bubbles" like on eye lids Now getting more on side of face First noticed 2-3 months ago  No Rx for this No moisturizers No change in her products Does have dandruff--better wit new shampoo ([employer] Should[employer])  Occasional itching Dry skin on eyelids also  Also some dark spots on skin  No current outpatient prescriptions on file prior to visit.   No current facility-administered medications on file prior to visit.    No Known Allergies  Past Medical History  Diagnosis Date  . Depression     Past Surgical History  Procedure Laterality Date  . Cholecystectomy  2007    Family History  Problem Relation Age of Onset  . Hypertension Other   . Diabetes Other     Social History   Social History  . Marital Status: Married    Spouse Name: N/A  . Number of Children: 2  . Years of Education: N/A   Occupational History  . Stay at home mom    Social History Main Topics  . Smoking status: Never Smoker   . Smokeless tobacco: Never Used  . Alcohol Use: No  . Drug Use: No  . Sexual Activity: Not on file   Other Topics Concern  . Not on file   Social History Narrative   Review of Systems No fever Not sick  Still with some cough--no real change. Zyrtec did help this year--still some mucus    Objective:   Physical Exam  Skin:  Skin seems oily on forehead Has small papules on lid and along nose  Tiny benign nevi on arms Very small seb keratosis on left leg Tiny hyperpigmented spots on lips (advised dermatologist if enlargens)          Assessment & Plan:

## 2015-01-28 NOTE — Progress Notes (Signed)
Pre visit review using our clinic review tool, if applicable. No additional management support is needed unless otherwise documented below in the visit note. 

## 2015-01-28 NOTE — Assessment & Plan Note (Signed)
Scalp clear on exam but has mild T-distribution findings on face Will try 2.5% hydrocort cream To derm if persists or if hyperpigmented areas in lips get bigger

## 2015-07-06 ENCOUNTER — Emergency Department (HOSPITAL_COMMUNITY)
Admission: EM | Admit: 2015-07-06 | Discharge: 2015-07-06 | Disposition: A | Discharge status: Home / Self Care | Payer: Self-pay | Attending: Emergency Medicine | Admitting: Emergency Medicine

## 2015-07-06 ENCOUNTER — Encounter (HOSPITAL_COMMUNITY): Payer: Self-pay | Admitting: *Deleted

## 2015-07-06 DIAGNOSIS — R109 Unspecified abdominal pain: Secondary | ICD-10-CM

## 2015-07-06 DIAGNOSIS — R112 Nausea with vomiting, unspecified: Secondary | ICD-10-CM

## 2015-07-06 DIAGNOSIS — A084 Viral intestinal infection, unspecified: Secondary | ICD-10-CM | POA: Insufficient documentation

## 2015-07-06 DIAGNOSIS — Z3202 Encounter for pregnancy test, result negative: Secondary | ICD-10-CM | POA: Insufficient documentation

## 2015-07-06 DIAGNOSIS — Z8659 Personal history of other mental and behavioral disorders: Secondary | ICD-10-CM | POA: Insufficient documentation

## 2015-07-06 DIAGNOSIS — Z79899 Other long term (current) drug therapy: Secondary | ICD-10-CM | POA: Insufficient documentation

## 2015-07-06 LAB — COMPREHENSIVE METABOLIC PANEL
ALK PHOS: 65 U/L (ref 38–126)
ALT: 50 U/L (ref 14–54)
ANION GAP: 10 (ref 5–15)
AST: 37 U/L (ref 15–41)
Albumin: 4.4 g/dL (ref 3.5–5.0)
BILIRUBIN TOTAL: 1 mg/dL (ref 0.3–1.2)
BUN: 15 mg/dL (ref 6–20)
CALCIUM: 9.2 mg/dL (ref 8.9–10.3)
CO2: 22 mmol/L (ref 22–32)
Chloride: 104 mmol/L (ref 101–111)
Creatinine, Ser: 0.65 mg/dL (ref 0.44–1.00)
GFR calc Af Amer: >60 mL/min (ref >60)
GLUCOSE: 117 mg/dL — AB (ref 65–99)
POTASSIUM: 3.8 mmol/L (ref 3.5–5.1)
Sodium: 136 mmol/L (ref 135–145)
TOTAL PROTEIN: 7.6 g/dL (ref 6.5–8.1)

## 2015-07-06 LAB — CBC WITH DIFFERENTIAL/PLATELET
Basophils Absolute: 0 10*3/uL (ref 0.0–0.1)
Basophils Relative: 0 %
Eosinophils Absolute: 0 10*3/uL (ref 0.0–0.7)
Eosinophils Relative: 0 %
HEMATOCRIT: 40.7 % (ref 36.0–46.0)
HEMOGLOBIN: 13.5 g/dL (ref 12.0–15.0)
LYMPHS ABS: 0.8 10*3/uL (ref 0.7–4.0)
LYMPHS PCT: 17 %
MCH: 29.6 pg (ref 26.0–34.0)
MCHC: 33.2 g/dL (ref 30.0–36.0)
MCV: 89.3 fL (ref 78.0–100.0)
MONO ABS: 0.3 10*3/uL (ref 0.1–1.0)
MONOS PCT: 7 %
NEUTROS ABS: 3.4 10*3/uL (ref 1.7–7.7)
NEUTROS PCT: 76 %
Platelets: 191 10*3/uL (ref 150–400)
RBC: 4.56 MIL/uL (ref 3.87–5.11)
RDW: 13.2 % (ref 11.5–15.5)
WBC: 4.5 10*3/uL (ref 4.0–10.5)

## 2015-07-06 LAB — I-STAT BETA HCG BLOOD, ED (MC, WL, AP ONLY)

## 2015-07-06 LAB — LIPASE, BLOOD: LIPASE: 24 U/L (ref 11–51)

## 2015-07-06 MED ORDER — ONDANSETRON HCL 4 MG/2ML IJ SOLN
4.0000 mg | Freq: Once | INTRAMUSCULAR | Status: AC
  Administered 2015-07-06: 4 mg via INTRAVENOUS
  Filled 2015-07-06: qty 2

## 2015-07-06 MED ORDER — HYDROCODONE-ACETAMINOPHEN 5-325 MG PO TABS: 2.0000 | ORAL_TABLET | ORAL | Status: DC | PRN

## 2015-07-06 MED ORDER — ONDANSETRON HCL 4 MG PO TABS: 4.0000 mg | ORAL_TABLET | Freq: Four times a day (QID) | ORAL | Status: DC

## 2015-07-06 MED ORDER — HYDROMORPHONE HCL 1 MG/ML IJ SOLN
1.0000 mg | Freq: Once | INTRAMUSCULAR | Status: AC
Start: 2015-07-06 — End: 2015-07-06
  Administered 2015-07-06: 0.5 mg via INTRAVENOUS
  Filled 2015-07-06: qty 1

## 2015-07-06 NOTE — ED Notes (Signed)
PT states for 1 week she has had a sinus infection, given abx yesterday for the same. Last night she started having abdominal pain, n/v, body aches and fevers. No meds prior to arrival.

## 2015-07-06 NOTE — Discharge Instructions (Signed)
Abdominal Pain, Adult Many things can cause belly (abdominal) pain. Most times, the belly pain is not dangerous. Many cases of belly pain can be watched and treated at home. HOME CARE   Do not take medicines that help you go poop (laxatives) unless told to by your doctor.  Only take medicine as told by your doctor.  Eat or drink as told by your doctor. Your doctor will tell you if you should be on a special diet. GET HELP IF:  You do not know what is causing your belly pain.  You have belly pain while you are sick to your stomach (nauseous) or have runny poop (diarrhea).  You have pain while you pee or poop.  Your belly pain wakes you up at night.  You have belly pain that gets worse or better when you eat.  You have belly pain that gets worse when you eat fatty foods.  You have a fever. GET HELP RIGHT AWAY IF:   The pain does not go away within 2 hours.  You keep throwing up (vomiting).  The pain changes and is only in the right or left part of the belly.  You have bloody or tarry looking poop. MAKE SURE YOU:   Understand these instructions.  Will watch your condition.  Will get help right away if you are not doing well or get worse.   This information is not intended to replace advice given to you by your health care provider. Make sure you discuss any questions you have with your health care provider.   Document Released: 10/07/2007 Document Revised: 05/11/2014 Document Reviewed: 12/28/2012 Elsevier Interactive Patient Education 2016 [employer]  Nausea and Vomiting Nausea means you feel sick to your stomach. Throwing up (vomiting) is a reflex where stomach contents come out of your mouth. HOME CARE   Take medicine as told by your doctor.  Do not force yourself to eat. However, you do need to drink fluids.  If you feel like eating, eat a normal diet as told by your doctor.  Eat rice, wheat, potatoes, bread, lean meats, yogurt, fruits, and  vegetables.  Avoid high-fat foods.  Drink enough fluids to keep your pee (urine) clear or pale yellow.  Ask your doctor how to replace body fluid losses (rehydrate). Signs of body fluid loss (dehydration) include:  Feeling very thirsty.  Dry lips and mouth.  Feeling dizzy.  Dark pee.  Peeing less than normal.  Feeling confused.  Fast breathing or heart rate. GET HELP RIGHT AWAY IF:   You have blood in your throw up.  You have black or bloody poop (stool).  You have a bad headache or stiff neck.  You feel confused.  You have bad belly (abdominal) pain.  You have chest pain or trouble breathing.  You do not pee at least once every 8 hours.  You have cold, clammy skin.  You keep throwing up after 24 to 48 hours.  You have a fever. MAKE SURE YOU:   Understand these instructions.  Will watch your condition.  Will get help right away if you are not doing well or get worse.   This information is not intended to replace advice given to you by your health care provider. Make sure you discuss any questions you have with your health care provider.   Document Released: 10/07/2007 Document Revised: 07/13/2011 Document Reviewed: 09/19/2010 Elsevier Interactive Patient Education [employer].

## 2015-07-06 NOTE — ED Provider Notes (Signed)
CSN: [id number]     Arrival date & time 07/06/15  1953 History   First MD Initiated Contact with Patient 07/06/15 2016     Chief Complaint  Patient presents with  . Abdominal Pain      HPI PT states for 1 week she has had a sinus infection, given abx yesterday for the same. Last night she started having abdominal pain, n/v, body aches and fevers. No meds prior to arrival. Patient has had her gallbladder removed.  She denies any hematemesis.  She did vomit twice today.  She's had nausea.  She denies any lower abdominal pain.  She states she aches all over and feels like she has the "flu" Past Medical History  Diagnosis Date  . Depression    Past Surgical History  Procedure Laterality Date  . Cholecystectomy  2007   Family History  Problem Relation Age of Onset  . Hypertension Other   . Diabetes Other    Social History  Substance Use Topics  . Smoking status: Never Smoker   . Smokeless tobacco: Never Used  . Alcohol Use: No   OB History    No data available     Review of Systems  All other systems reviewed and are negative.     Allergies  Review of patient's allergies indicates no known allergies.  Home Medications   Prior to Admission medications   Medication Sig Start Date End Date Taking? Authorizing Provider  azithromycin (ZITHROMAX) 250 MG tablet Take 250 mg by mouth as directed. Take 2 tablets on day one and then 1 tablet daily for 4 days until finished.Started 5 day supply on 07/04/2015   Yes Historical Provider, MD  cetirizine (ZYRTEC) 10 MG tablet Take 10 mg by mouth daily.   Yes Historical Provider, MD  Multiple Vitamin (MULTIVITAMIN WITH MINERALS) TABS tablet Take 1 tablet by mouth daily.   Yes Historical Provider, MD  HYDROcodone-acetaminophen (NORCO/VICODIN) 5-325 MG tablet Take 2 tablets by mouth every 4 (four) hours as needed. 07/06/15   Leonard Schwartz, MD  hydrocortisone 2.5 % cream Apply topically 3 (three) times daily as needed. Patient not taking:  Reported on 07/06/2015 01/28/15   Venia Carbon, MD  ondansetron (ZOFRAN) 4 MG tablet Take 1 tablet (4 mg total) by mouth every 6 (six) hours. 07/06/15   Leonard Schwartz, MD   BP 133/81 mmHg  Pulse 118  Temp(Src) 100 F (37.8 C) (Oral)  Resp 16  Ht 5' (1.524 m)  Wt 130 lb (58.968 kg)  BMI 25.39 kg/m2  SpO2 96%  LMP 07/01/2015 Physical Exam  Constitutional: She is oriented to person, place, and time. She appears well-developed and well-nourished. No distress.  HENT:  Head: Normocephalic and atraumatic.  Eyes: Pupils are equal, round, and reactive to light.  Neck: Normal range of motion.  Cardiovascular: Normal rate and intact distal pulses.   Pulmonary/Chest: Effort normal. No respiratory distress.  Abdominal: Soft. Normal appearance and bowel sounds are normal. She exhibits no distension. There is no tenderness. There is no rebound and no guarding.  Musculoskeletal: Normal range of motion.  Neurological: She is alert and oriented to person, place, and time. No cranial nerve deficit.  Skin: Skin is warm and dry. No rash noted.  Psychiatric: She has a normal mood and affect. Her behavior is normal.  Nursing note and vitals reviewed.   ED Course  Procedures (including critical care time) Medications  HYDROmorphone (DILAUDID) injection 1 mg (not administered)  ondansetron (ZOFRAN) injection 4 mg (not  administered)  ondansetron [facility]) injection 4 mg (4 mg Intravenous Given 07/06/15 2113)    Labs Review Labs Reviewed  COMPREHENSIVE METABOLIC PANEL - Abnormal; Notable for the following:    Glucose, Bld 117 (*)    All other components within normal limits  CBC WITH DIFFERENTIAL/PLATELET  LIPASE, BLOOD  I-STAT BETA HCG BLOOD, ED ([facility], [facility], [facility] ONLY)    Imaging Review No results found. I have personally reviewed and evaluated these images and lab results as part of my medical decision-making.  I offered the patient a CT scan of the abdomen for further evaluation but in light of her  labs and her symptoms I suspect she has a viral gastroenteritis.  Patient does not want a CT at this time and elect to to try symptomatic treatment.  MDM   Final diagnoses:  Viral gastroenteritis  Non-intractable vomiting with nausea, vomiting of unspecified type  Abdominal pain, unspecified abdominal location        Leonard Schwartz, MD 07/06/15 2259

## 2015-07-08 ENCOUNTER — Telehealth: Payer: Self-pay

## 2015-07-08 NOTE — Telephone Encounter (Signed)
Spoke to patient. She states she is feeling much better from her ER visit

## 2015-07-08 NOTE — Telephone Encounter (Signed)
Good to hear

## 2015-10-15 ENCOUNTER — Emergency Department (HOSPITAL_COMMUNITY): Payer: Self-pay

## 2015-10-15 ENCOUNTER — Ambulatory Visit: Payer: Self-pay | Admitting: Family

## 2015-10-15 ENCOUNTER — Emergency Department (HOSPITAL_COMMUNITY)
Admission: EM | Admit: 2015-10-15 | Discharge: 2015-10-15 | Disposition: A | Discharge status: Home / Self Care | Payer: Self-pay | Attending: Emergency Medicine | Admitting: Emergency Medicine

## 2015-10-15 ENCOUNTER — Encounter (HOSPITAL_COMMUNITY): Payer: Self-pay

## 2015-10-15 DIAGNOSIS — Z79899 Other long term (current) drug therapy: Secondary | ICD-10-CM | POA: Insufficient documentation

## 2015-10-15 DIAGNOSIS — J069 Acute upper respiratory infection, unspecified: Secondary | ICD-10-CM | POA: Insufficient documentation

## 2015-10-15 DIAGNOSIS — F329 Major depressive disorder, single episode, unspecified: Secondary | ICD-10-CM | POA: Insufficient documentation

## 2015-10-15 NOTE — ED Provider Notes (Signed)
CSN: [id number]     Arrival date & time 10/15/15  1212 History  By signing my name below, I, Hansel Feinstein, attest that this documentation has been prepared under the direction and in the presence of  Comer Locket, PA-C. Electronically Signed: Hansel Feinstein, ED Scribe. 10/15/2015. 12:54 PM.    Chief Complaint  Patient presents with  . Nasal Congestion  . Cough   The history is provided by the patient. No language interpreter was used.   HPI Comments: Jenny Wagner is a 35 y.o. female who presents to the Emergency Department complaining of moderate, intermittent productive cough with yellow phlegm onset last week with associated nasal and chest congestion, sore throat, HA with coughing, chills. Per pt, her symptoms improved before worsening again 2 days ago. Also reports the symptoms are cyclical and have been ongoing over the past one year. Pt states her cough is worsened in the morning and gradually improves throughout the day. She reports she has tried Zyrtec and Tylenol cough and cold with no relief. Per pt, she has experienced similar symptoms seasonally and with prior episode of pneumonia. Pt is a non-smoker. She denies fever, sinus pressure or facial pain. NKDA. No other modifying factors  Past Medical History  Diagnosis Date  . Depression    Past Surgical History  Procedure Laterality Date  . Cholecystectomy  2007   Family History  Problem Relation Age of Onset  . Hypertension Other   . Diabetes Other    Social History  Substance Use Topics  . Smoking status: Never Smoker   . Smokeless tobacco: Never Used  . Alcohol Use: No   OB History    No data available     Review of Systems A 10 point review of systems was completed and was negative except for pertinent positives and negatives as mentioned in the history of present illness.   Allergies  Review of patient's allergies indicates no known allergies.  Home Medications   Prior to Admission medications   Medication  Sig Start Date End Date Taking? Authorizing Provider  azithromycin (ZITHROMAX) 250 MG tablet Take 250 mg by mouth as directed. Take 2 tablets on day one and then 1 tablet daily for 4 days until finished.Started 5 day supply on 07/04/2015    Historical Provider, MD  cetirizine (ZYRTEC) 10 MG tablet Take 10 mg by mouth daily.    Historical Provider, MD  HYDROcodone-acetaminophen (NORCO/VICODIN) 5-325 MG tablet Take 2 tablets by mouth every 4 (four) hours as needed. 07/06/15   Leonard Schwartz, MD  hydrocortisone 2.5 % cream Apply topically 3 (three) times daily as needed. Patient not taking: Reported on 07/06/2015 01/28/15   Venia Carbon, MD  Multiple Vitamin (MULTIVITAMIN WITH MINERALS) TABS tablet Take 1 tablet by mouth daily.    Historical Provider, MD  ondansetron (ZOFRAN) 4 MG tablet Take 1 tablet (4 mg total) by mouth every 6 (six) hours. 07/06/15   Leonard Schwartz, MD   BP 148/94 mmHg  Pulse 83  Temp(Src) 98.6 F (37 C) (Oral)  Resp 18  SpO2 100%  LMP 09/21/2015 Physical Exam  Constitutional: She appears well-developed and well-nourished.  HENT:  Head: Normocephalic.  Nose: Nose normal.  Mouth/Throat: Oropharynx is clear and moist. No oropharyngeal exudate.  No facial pain, redness or swelling. Oropharynx is clear and moist.  Eyes: Conjunctivae and EOM are normal. Right eye exhibits no discharge. Left eye exhibits no discharge. No scleral icterus.  Neck: Normal range of motion.  Cardiovascular: Normal rate, regular  rhythm and normal heart sounds.   Heart sounds normal. RRR.    Pulmonary/Chest: Effort normal and breath sounds normal. No respiratory distress. She has no wheezes. She has no rales.  Lungs CTA bilaterally.   Abdominal: She exhibits no distension.  Musculoskeletal: Normal range of motion.  Lymphadenopathy:    She has no cervical adenopathy.  Neurological: She is alert.  Skin: Skin is warm and dry.  Psychiatric: She has a normal mood and affect. Her behavior is normal.   Nursing note and vitals reviewed.   ED Course  Procedures (including critical care time) DIAGNOSTIC STUDIES: Oxygen Saturation is 100% on RA, normal by my interpretation.    COORDINATION OF CARE: 12:52 PM Discussed treatment plan with pt at bedside which includes Flonase, benadryl, sudafed, PCP f/u and pt agreed to plan.   Meds given in ED: Medications - No data to display  New Prescriptions   No medications on file     Filed Vitals:   10/15/15 1222  BP: 148/94  Pulse: 83  Temp: 98.6 F (37 C)  Resp: 18     MDM  Patients symptoms are consistent with URI, likely viral versus allergic etiology. Discussed that antibiotics are not indicated for viral infections. Discussed risks and benefits associated with chest x-ray, patient decides to forego chest x-ray at this time. I feel this is reasonable as there is no evidence of infectious process and she has a benign cardiopulmonary exam. Pt will be discharged to follow up with PCP in the next 3 days.  Discussed symptomatic treatment at home. Verbalizes understanding and is agreeable with plan. Pt is hemodynamically stable & in NAD prior to dc.  Final diagnoses:  URI (upper respiratory infection)    I personally performed the services described in this documentation, which was scribed in my presence. The recorded information has been reviewed and is accurate.   Comer Locket, PA-C 10/15/15 [place], MD 10/16/15 [phone]

## 2015-10-15 NOTE — Discharge Instructions (Signed)
There does not appear to be an emergent cause for your symptoms at this time. Your symptoms are likely due to allergies versus a possible virus. You may try switching to Flonase as we discussed. You may also try Mucinex with Sudafed to help with your cough and congestion. Please follow-up with your doctor in the next 2-3 days for reevaluation. Return to ED for new or worsening symptoms as we discussed.  Upper Respiratory Infection, Adult Most upper respiratory infections (URIs) are a viral infection of the air passages leading to the lungs. A URI affects the nose, throat, and upper air passages. The most common type of URI is nasopharyngitis and is typically referred to as "the common cold." URIs run their course and usually go away on their own. Most of the time, a URI does not require medical attention, but sometimes a bacterial infection in the upper airways can follow a viral infection. This is called a secondary infection. Sinus and middle ear infections are common types of secondary upper respiratory infections. Bacterial pneumonia can also complicate a URI. A URI can worsen asthma and chronic obstructive pulmonary disease (COPD). Sometimes, these complications can require emergency medical care and may be life threatening.  CAUSES Almost all URIs are caused by viruses. A virus is a type of germ and can spread from one person to another.  RISKS FACTORS You may be at risk for a URI if:   You smoke.   You have chronic heart or lung disease.  You have a weakened defense (immune) system.   You are very young or very old.   You have nasal allergies or asthma.  You work in crowded or poorly ventilated areas.  You work in health care facilities or schools. SIGNS AND SYMPTOMS  Symptoms typically develop 2-3 days after you come in contact with a cold virus. Most viral URIs last 7-10 days. However, viral URIs from the influenza virus (flu virus) can last 14-18 days and are typically more  severe. Symptoms may include:   Runny or stuffy (congested) nose.   Sneezing.   Cough.   Sore throat.   Headache.   Fatigue.   Fever.   Loss of appetite.   Pain in your forehead, behind your eyes, and over your cheekbones (sinus pain).  Muscle aches.  DIAGNOSIS  Your health care provider may diagnose a URI by:  Physical exam.  Tests to check that your symptoms are not due to another condition such as:  Strep throat.  Sinusitis.  Pneumonia.  Asthma. TREATMENT  A URI goes away on its own with time. It cannot be cured with medicines, but medicines may be prescribed or recommended to relieve symptoms. Medicines may help:  Reduce your fever.  Reduce your cough.  Relieve nasal congestion. HOME CARE INSTRUCTIONS   Take medicines only as directed by your health care provider.   Gargle warm saltwater or take cough drops to comfort your throat as directed by your health care provider.  Use a warm mist humidifier or inhale steam from a shower to increase air moisture. This may make it easier to breathe.  Drink enough fluid to keep your urine clear or pale yellow.   Eat soups and other clear broths and maintain good nutrition.   Rest as needed.   Return to work when your temperature has returned to normal or as your health care provider advises. You may need to stay home longer to avoid infecting others. You can also use a face mask and  careful hand washing to prevent spread of the virus.  Increase the usage of your inhaler if you have asthma.   Do not use any tobacco products, including cigarettes, chewing tobacco, or electronic cigarettes. If you need help quitting, ask your health care provider. PREVENTION  The best way to protect yourself from getting a cold is to practice good hygiene.   Avoid oral or hand contact with people with cold symptoms.   Wash your hands often if contact occurs.  There is no clear evidence that vitamin C, vitamin  E, echinacea, or exercise reduces the chance of developing a cold. However, it is always recommended to get plenty of rest, exercise, and practice good nutrition.  SEEK MEDICAL CARE IF:   You are getting worse rather than better.   Your symptoms are not controlled by medicine.   You have chills.  You have worsening shortness of breath.  You have brown or red mucus.  You have yellow or brown nasal discharge.  You have pain in your face, especially when you bend forward.  You have a fever.  You have swollen neck glands.  You have pain while swallowing.  You have white areas in the back of your throat. SEEK IMMEDIATE MEDICAL CARE IF:   You have severe or persistent:  Headache.  Ear pain.  Sinus pain.  Chest pain.  You have chronic lung disease and any of the following:  Wheezing.  Prolonged cough.  Coughing up blood.  A change in your usual mucus.  You have a stiff neck.  You have changes in your:  Vision.  Hearing.  Thinking.  Mood. MAKE SURE YOU:   Understand these instructions.  Will watch your condition.  Will get help right away if you are not doing well or get worse.   This information is not intended to replace advice given to you by your health care provider. Make sure you discuss any questions you have with your health care provider.   Document Released: 10/14/2000 Document Revised: 09/04/2014 Document Reviewed: 07/26/2013 Elsevier Interactive Patient Education [employer].

## 2015-10-15 NOTE — ED Notes (Signed)
Pt here with cough and chest congestion.  States occuring x 1 month.  Has had multiple episodes of same for last year and a half.  No fever.  Cough is productive

## 2015-10-17 ENCOUNTER — Telehealth: Payer: Self-pay

## 2015-10-17 NOTE — Telephone Encounter (Signed)
Called pt to f/u on ER visit for cough/uri. She said she is feeling a little better, but is not able to get anything out of her chest. She will call us if she needs an OV

## 2015-10-18 ENCOUNTER — Telehealth: Payer: Self-pay | Admitting: Internal Medicine

## 2015-10-18 NOTE — Telephone Encounter (Signed)
ok by me - thanks.

## 2015-10-18 NOTE — Telephone Encounter (Signed)
Patient is asking to transfer to [place] from [facility].  Patient would like to switch to Dr.Gutierrez because he speaks spanish.  Can patient switch to Dr.Gutierrez.

## 2015-10-18 NOTE — Telephone Encounter (Signed)
Yes, that makes sense

## 2015-10-21 ENCOUNTER — Encounter: Payer: Self-pay | Admitting: Internal Medicine

## 2015-10-21 ENCOUNTER — Ambulatory Visit (INDEPENDENT_AMBULATORY_CARE_PROVIDER_SITE_OTHER)
Admission: RE | Admit: 2015-10-21 | Discharge: 2015-10-21 | Disposition: A | Discharge status: Home / Self Care | Payer: Self-pay | Source: Ambulatory Visit | Attending: Internal Medicine | Admitting: Internal Medicine

## 2015-10-21 ENCOUNTER — Ambulatory Visit (INDEPENDENT_AMBULATORY_CARE_PROVIDER_SITE_OTHER): Payer: Self-pay | Admitting: Internal Medicine

## 2015-10-21 VITALS — BP 116/80 | HR 68 | Temp 98.1°F | Wt 130.0 lb

## 2015-10-21 DIAGNOSIS — R05 Cough: Secondary | ICD-10-CM

## 2015-10-21 DIAGNOSIS — L659 Nonscarring hair loss, unspecified: Secondary | ICD-10-CM

## 2015-10-21 DIAGNOSIS — R059 Cough, unspecified: Secondary | ICD-10-CM

## 2015-10-21 LAB — COMPREHENSIVE METABOLIC PANEL WITH GFR
ALT: 41 U/L — ABNORMAL HIGH (ref 0–35)
AST: 19 U/L (ref 0–37)
Albumin: 4.2 g/dL (ref 3.5–5.2)
Alkaline Phosphatase: 60 U/L (ref 39–117)
BUN: 13 mg/dL (ref 6–23)
CO2: 25 meq/L (ref 19–32)
Calcium: 9.4 mg/dL (ref 8.4–10.5)
Chloride: 107 meq/L (ref 96–112)
Creatinine, Ser: 0.66 mg/dL (ref 0.40–1.20)
GFR: 107.23 mL/min
Glucose, Bld: 93 mg/dL (ref 70–99)
Potassium: 4.2 meq/L (ref 3.5–5.1)
Sodium: 138 meq/L (ref 135–145)
Total Bilirubin: 0.3 mg/dL (ref 0.2–1.2)
Total Protein: 7.4 g/dL (ref 6.0–8.3)

## 2015-10-21 LAB — CBC WITH DIFFERENTIAL/PLATELET
BASOS PCT: 0.6 % (ref 0.0–3.0)
Basophils Absolute: 0 10*3/uL (ref 0.0–0.1)
EOS ABS: 0.1 10*3/uL (ref 0.0–0.7)
Eosinophils Relative: 1.8 % (ref 0.0–5.0)
HCT: 38.9 % (ref 36.0–46.0)
Hemoglobin: 13.1 g/dL (ref 12.0–15.0)
LYMPHS ABS: 2.1 10*3/uL (ref 0.7–4.0)
LYMPHS PCT: 46 % (ref 12.0–46.0)
MCHC: 33.8 g/dL (ref 30.0–36.0)
MCV: 88.4 fl (ref 78.0–100.0)
MONO ABS: 0.3 10*3/uL (ref 0.1–1.0)
Monocytes Relative: 6.7 % (ref 3.0–12.0)
NEUTROS PCT: 44.9 % (ref 43.0–77.0)
Neutro Abs: 2 10*3/uL (ref 1.4–7.7)
PLATELETS: 214 10*3/uL (ref 150.0–400.0)
RBC: 4.4 Mil/uL (ref 3.87–5.11)
RDW: 13.9 % (ref 11.5–15.5)
WBC: 4.6 10*3/uL (ref 4.0–10.5)

## 2015-10-21 LAB — T4, FREE: FREE T4: 0.84 ng/dL (ref 0.60–1.60)

## 2015-10-21 MED ORDER — OMEPRAZOLE 20 MG PO CPDR: 20.0000 mg | DELAYED_RELEASE_CAPSULE | Freq: Every day | ORAL | Status: DC

## 2015-10-21 MED ORDER — MONTELUKAST SODIUM 10 MG PO TABS: 10.0000 mg | ORAL_TABLET | Freq: Every day | ORAL | Status: DC

## 2015-10-21 MED ORDER — ALBUTEROL SULFATE HFA 108 (90 BASE) MCG/ACT IN AERS: 2.0000 | INHALATION_SPRAY | Freq: Four times a day (QID) | RESPIRATORY_TRACT | Status: DC | PRN

## 2015-10-21 NOTE — Progress Notes (Signed)
   Subjective:    Patient ID: Jenny Wagner, female    DOB: [birth year], 35 y.o.   MRN: [id number]  HPI Here due to cough  Went to ER due to cough--last week Ongoing cough over a year---intermittently Constant for past 3 weeks  Has had some SOB and lightheadedness No history of asthma--- other than at age 47-5 (had Rx then--she doesn't remember what) Cough all day and night--other times was more noticeable in the mornings Has had some sensitivity to the pollen Some wheeze for the past week  Also concerned about hair falling out--for 6 months No bald spots--just falls out easy Scalp is itchy  Current Outpatient Prescriptions on File Prior to Visit  Medication Sig Dispense Refill  . cetirizine (ZYRTEC) 10 MG tablet Take 10 mg by mouth daily.    . Multiple Vitamin (MULTIVITAMIN WITH MINERALS) TABS tablet Take 1 tablet by mouth daily.     No current facility-administered medications on file prior to visit.    No Known Allergies  Past Medical History  Diagnosis Date  . Depression     Past Surgical History  Procedure Laterality Date  . Cholecystectomy  2007    Family History  Problem Relation Age of Onset  . Hypertension Other   . Diabetes Other     Social History   Social History  . Marital Status: Married    Spouse Name: N/A  . Number of Children: 2  . Years of Education: N/A   Occupational History  . Stay at home mom    Social History Main Topics  . Smoking status: Never Smoker   . Smokeless tobacco: Never Used  . Alcohol Use: No  . Drug Use: No  . Sexual Activity: Not on file   Other Topics Concern  . Not on file   Social History Narrative   Review of Systems No trouble with heartburn Some trouble with swallowing--- especially liquids (will have some cough) Got tight feeling in throat yesterday trying to blow nose    Objective:   Physical Exam  HENT:  Mouth/Throat: Oropharynx is clear and moist. No oropharyngeal exudate.  No sinus  tenderness TMs normal Mild nasal congestion   Neck: Normal range of motion. Neck supple. No thyromegaly present.  Pulmonary/Chest: Effort normal and breath sounds normal. No respiratory distress. She has no wheezes. She has no rales.  Musculoskeletal: She exhibits no edema.  Lymphadenopathy:    She has no cervical adenopathy.  Skin:  Blanching papular rash on upper back/shoulders. Scalp is negative No alopecia          Assessment & Plan:

## 2015-10-21 NOTE — Progress Notes (Signed)
Pre visit review using our clinic review tool, if applicable. No additional management support is needed unless otherwise documented below in the visit note. 

## 2015-10-21 NOTE — Assessment & Plan Note (Addendum)
This is ongoing Past history of childhood asthma ?some allergies No heartburn--but has some dysphagia and throat symptoms  Spirometry normal CXR unremarkable Will try montelukast and PPI Albuterol PRN If cough persists, to pulmonary

## 2015-10-21 NOTE — Patient Instructions (Signed)
If your cough is not better in the next couple of weeks, let me know and I will set you up with a pulmonary (lung) specialist.

## 2015-10-21 NOTE — Assessment & Plan Note (Signed)
Probably not pathologic Scalp looks okay--but using Head and Shoulders

## 2016-07-17 ENCOUNTER — Encounter (INDEPENDENT_AMBULATORY_CARE_PROVIDER_SITE_OTHER): Payer: Self-pay

## 2016-07-17 ENCOUNTER — Encounter: Payer: Self-pay | Admitting: Family Medicine

## 2016-07-17 ENCOUNTER — Ambulatory Visit (INDEPENDENT_AMBULATORY_CARE_PROVIDER_SITE_OTHER): Payer: Self-pay | Admitting: Family Medicine

## 2016-07-17 VITALS — BP 112/76 | HR 68 | Temp 98.5°F | Ht 61.0 in | Wt 133.5 lb

## 2016-07-17 DIAGNOSIS — R0982 Postnasal drip: Secondary | ICD-10-CM

## 2016-07-17 DIAGNOSIS — M62838 Other muscle spasm: Secondary | ICD-10-CM

## 2016-07-17 DIAGNOSIS — J301 Allergic rhinitis due to pollen: Secondary | ICD-10-CM

## 2016-07-17 NOTE — Progress Notes (Signed)
Pre visit review using our clinic review tool, if applicable. No additional management support is needed unless otherwise documented below in the visit note. 

## 2016-07-17 NOTE — Progress Notes (Signed)
Dr. Frederico Hamman T. Emaley Applin, MD, [facility] [place], [address] Phone: [phone] Fax: [phone]  07/17/2016  Patient: Jenny Wagner, MRN: [id number], DOB: 12/03/1980, 36 y.o.  Primary Physician:  Ria Bush, MD   Chief Complaint  Patient presents with  . Cough  . Sore Throat  . Back Pain   Subjective:   Jenny Wagner is a 36 y.o. very pleasant female patient who presents with the following:  ST, congested, still has some pain in her back particularly  Now she is primarily complaining about some sore throat and some coughing.  This is been persistent for many months. Cough - for about 4 mo Had some asthma - went away.   Has been taking some Zyrtec. This is seem to help it.  R periscapular trap and rhomboid - pain on the right.  She is not really having much significant pain or distress in her lower back.  Past Medical History, Surgical History, Social History, Family History, Problem List, Medications, and Allergies have been reviewed and updated if relevant.  Patient Active Problem List   Diagnosis Date Noted  . Hair loss 10/21/2015  . Seborrheic dermatitis 01/28/2015  . Cough 12/13/2013    Past Medical History:  Diagnosis Date  . Depression     Past Surgical History:  Procedure Laterality Date  . CHOLECYSTECTOMY  2007    Social History   Social History  . Marital status: Married    Spouse name: N/A  . Number of children: 2  . Years of education: N/A   Occupational History  . Stay at home mom    Social History Main Topics  . Smoking status: Never Smoker  . Smokeless tobacco: Never Used  . Alcohol use No  . Drug use: No  . Sexual activity: Not on file   Other Topics Concern  . Not on file   Social History Narrative  . No narrative on file    Family History  Problem Relation Age of Onset  . Hypertension Other   . Diabetes Other     No Known Allergies  Medication list  reviewed and updated in full in [place] [facility].  ROS: GEN: Acute illness details above GI: Tolerating PO intake GU: maintaining adequate hydration and urination Pulm: No SOB Interactive and getting along well at home.  Otherwise, ROS is as per the HPI.  Objective:   BP 112/76 (BP Location: Right Arm, Patient Position: Sitting, Cuff Size: Normal)   Pulse 68   Temp 98.5 F (36.9 C) (Oral)   Ht 5\' 1"  (1.549 m)   Wt 133 lb 8 oz (60.6 kg)   LMP 07/08/2016   SpO2 98%   BMI 25.22 kg/m    Gen: WDWN, NAD; A & O x3, cooperative. Pleasant.Globally Non-toxic HEENT: Normocephalic and atraumatic. Throat clear, w/o exudate, R TM clear, L TM - good landmarks, No fluid present. rhinnorhea.  MMM. B VERY SWOLLEN, BOGGY TURBINATES Frontal sinuses: NT Max sinuses: NT NECK: Anterior cervical  LAD is absent CV: RRR, No M/G/R, cap refill <2 sec PULM: Breathing comfortably in no respiratory distress. no wheezing, crackles, rhonchi EXT: No c/c/e PSYCH: Friendly, good eye contact MSK: Nml gait   r trap/rhomboids ttp   Laboratory and Imaging Data:  Assessment and Plan:   Acute seasonal allergic rhinitis due to pollen  Postnasal drip  Trapezius muscle spasm  On exam, very swollen and boggy turbulence and history seems most consistent with  postnasal drip and cough from postnasal drip and allergies.  Continue with Zyrtec and add nasal steroids.  Review trap and rhomboid rehabilitation.  Follow-up: No Follow-up on file.  Medications Discontinued During This Encounter  Medication Reason  . albuterol (PROVENTIL HFA;VENTOLIN HFA) 108 (90 Base) MCG/ACT inhaler Patient has not taken in last 30 days  . montelukast (SINGULAIR) 10 MG tablet   . Multiple Vitamin (MULTIVITAMIN WITH MINERALS) TABS tablet Patient Preference  . omeprazole (PRILOSEC) 20 MG capsule Patient has not taken in last 30 days   No orders of the defined types were placed in this encounter.   Signed,  Maud Deed. Leronda Lewers,  MD   Allergies as of 07/17/2016   No Known Allergies     Medication List       Accurate as of 07/17/16 11:59 PM. Always use your most recent med list.          cetirizine 10 MG tablet Commonly known as:  ZYRTEC Take 10 mg by mouth daily.

## 2016-07-17 NOTE — Patient Instructions (Signed)
Fluticasone - generic over the counter (generic Flonase) 2 sprays each nostril once a day

## 2017-06-04 ENCOUNTER — Other Ambulatory Visit: Payer: Self-pay

## 2017-06-04 ENCOUNTER — Ambulatory Visit (INDEPENDENT_AMBULATORY_CARE_PROVIDER_SITE_OTHER): Payer: Self-pay

## 2017-06-04 ENCOUNTER — Encounter: Payer: Self-pay | Admitting: Emergency Medicine

## 2017-06-04 ENCOUNTER — Ambulatory Visit (INDEPENDENT_AMBULATORY_CARE_PROVIDER_SITE_OTHER): Payer: Self-pay | Admitting: Emergency Medicine

## 2017-06-04 VITALS — BP 114/70 | HR 66 | Temp 98.0°F | Resp 16 | Ht 60.0 in | Wt 126.4 lb

## 2017-06-04 DIAGNOSIS — R05 Cough: Secondary | ICD-10-CM

## 2017-06-04 DIAGNOSIS — R059 Cough, unspecified: Secondary | ICD-10-CM

## 2017-06-04 DIAGNOSIS — J45909 Unspecified asthma, uncomplicated: Secondary | ICD-10-CM

## 2017-06-04 DIAGNOSIS — J22 Unspecified acute lower respiratory infection: Secondary | ICD-10-CM | POA: Insufficient documentation

## 2017-06-04 DIAGNOSIS — R053 Chronic cough: Secondary | ICD-10-CM

## 2017-06-04 MED ORDER — BENZONATATE 200 MG PO CAPS: 200.0000 mg | ORAL_CAPSULE | Freq: Two times a day (BID) | ORAL | 0 refills | Status: DC | PRN

## 2017-06-04 MED ORDER — ALBUTEROL SULFATE HFA 108 (90 BASE) MCG/ACT IN AERS: 2.0000 | INHALATION_SPRAY | Freq: Four times a day (QID) | RESPIRATORY_TRACT | 3 refills | Status: DC | PRN

## 2017-06-04 MED ORDER — PREDNISONE 20 MG PO TABS: 40.0000 mg | ORAL_TABLET | Freq: Every day | ORAL | 0 refills | Status: AC

## 2017-06-04 MED ORDER — AZITHROMYCIN 250 MG PO TABS: ORAL_TABLET | ORAL | 0 refills | Status: DC

## 2017-06-04 MED ORDER — DM-GUAIFENESIN ER 30-600 MG PO TB12: 1.0000 | ORAL_TABLET | Freq: Two times a day (BID) | ORAL | 1 refills | Status: AC

## 2017-06-04 NOTE — Assessment & Plan Note (Signed)
Suspected allergic component.  Suspected reactive airway disease.  May need further pulmonary evaluation.  Will reassess in 2 weeks.  We will start medications in the meantime.

## 2017-06-04 NOTE — Progress Notes (Signed)
[place] 37 y.o.   Chief Complaint  Patient presents with  . Cough     x 10 days productived yellow mucus    HISTORY OF PRESENT ILLNESS: This is a 37 y.o. female complaining of chronic cough for 3 years but worse the last 10 days.  Sputum has turned yellow the last 5 days.  Non-smoker.  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  Denies fever, chills, difficulty breathing, chest pain, nausea, vomiting.  Denies environmental chemical exposure.  Saw a pulmonologist 3 months ago.  Had normal PFTs.  Has intermittent allergic symptoms.  Zyrtec helps with the symptoms.  Denies any other significant symptoms.  No symptoms of GERD.  HPI   Prior to Admission medications   Medication Sig Start Date End Date Taking? Authorizing Provider  cetirizine (ZYRTEC) 10 MG tablet Take 10 mg by mouth daily.   Yes [provider]    No Known Allergies  Patient Active Problem List   Diagnosis Date Noted  . Hair loss 10/21/2015  . Seborrheic dermatitis 01/28/2015  . Cough 12/13/2013    Past Medical History:  Diagnosis Date  . Depression     Past Surgical History:  Procedure Laterality Date  . CHOLECYSTECTOMY  2007    Social History   Socioeconomic History  . Marital status: Married    Spouse name: Not on file  . Number of children: 2  . Years of education: Not on file  . Highest education level: Not on file  Social Needs  . Financial resource strain: Not on file  . Food insecurity - worry: Not on file  . Food insecurity - inability: Not on file  . Transportation needs - medical: Not on file  . Transportation needs - non-medical: Not on file  Occupational History  . Occupation: Stay at home mom  Tobacco Use  . Smoking status: Never Smoker  . Smokeless tobacco: Never Used  Substance and Sexual Activity  . Alcohol use: No  . Drug use: No  . Sexual activity: Not on file  Other Topics Concern  . Not on file  Social History Narrative  . Not on file     Family History  Problem Relation Age of Onset  . Hypertension Other   . Diabetes Other      Review of Systems  Constitutional: Negative.  Negative for chills, fever and malaise/fatigue.  HENT: Negative.  Negative for congestion, nosebleeds, sinus pain and sore throat.   Eyes: Negative.  Negative for discharge and redness.  Respiratory: Positive for cough.   Gastrointestinal: Negative for abdominal pain, diarrhea, heartburn, nausea and vomiting.  Genitourinary: Negative for dysuria and hematuria.  Musculoskeletal: Negative for back pain, myalgias and neck pain.  Skin: Negative for rash.  Neurological: Negative.  Negative for dizziness and headaches.  Endo/Heme/Allergies: Positive for environmental allergies.  All other systems reviewed and are negative.  Vitals:   06/04/17 0955  BP: 114/70  Pulse: 66  Resp: 16  Temp: 98 F (36.7 C)  SpO2: 98%     Physical Exam  Constitutional: She is oriented to person, place, and time. She appears well-developed and well-nourished.  HENT:  Head: Normocephalic and atraumatic.  Right Ear: External ear normal.  Left Ear: External ear normal.  Nose: Nose normal.  Mouth/Throat: Oropharynx is clear and moist.  Eyes: Conjunctivae and EOM are normal. Pupils are equal, round, and reactive to light.  Neck: Normal range of motion. Neck supple. No JVD present. No thyromegaly present.  Cardiovascular: Normal rate, regular rhythm and normal heart sounds.  Pulmonary/Chest: Effort normal and breath sounds normal. No respiratory distress.  Abdominal: Soft. Bowel sounds are normal. She exhibits no distension. There is no tenderness.  Musculoskeletal: Normal range of motion. She exhibits no edema.  Lymphadenopathy:    She has no cervical adenopathy.  Neurological: She is alert and oriented to person, place, and time. No sensory deficit. She exhibits normal muscle tone.  Skin: Skin is warm and dry. Capillary refill takes less than 2 seconds. No rash  noted.  Psychiatric: She has a normal mood and affect. Her behavior is normal.  Vitals reviewed.  Dg Chest 2 View  Result Date: 06/04/2017 CLINICAL DATA:  Chronic cough EXAM: CHEST  2 VIEW COMPARISON:  10/21/2015 FINDINGS: The heart size and mediastinal contours are within normal limits. Both lungs are clear. No pleural effusion or pneumothorax. The visualized skeletal structures are unremarkable. IMPRESSION: Normal chest radiographs. Electronically Signed   By: Lajean Manes M.D.   On: 06/04/2017 10:30   Chronic cough Suspected allergic component.  Suspected reactive airway disease.  May need further pulmonary evaluation.  Will reassess in 2 weeks.  We will start medications in the meantime.    ASSESSMENT & PLAN:  Shannah was seen today for cough.  Diagnoses and all orders for this visit:  Cough -     DG Chest 2 View; Future -     benzonatate (TESSALON) 200 MG capsule; Take 1 capsule (200 mg total) by mouth 2 (two) times daily as needed for cough. -     dextromethorphan-guaiFENesin (MUCINEX DM) 30-600 MG 12hr tablet; Take 1 tablet by mouth 2 (two) times daily for 7 days. -     predniSONE (DELTASONE) 20 MG tablet; Take 2 tablets (40 mg total) by mouth daily with breakfast for 5 days.  Lower respiratory infection -     azithromycin (ZITHROMAX) 250 MG tablet; Sig as indicated -     predniSONE (DELTASONE) 20 MG tablet; Take 2 tablets (40 mg total) by mouth daily with breakfast for 5 days.  Mild reactive airways disease, unspecified whether persistent -     albuterol (PROVENTIL HFA;VENTOLIN HFA) 108 (90 Base) MCG/ACT inhaler; Inhale 2 puffs into the lungs every 6 (six) hours as needed for wheezing or shortness of breath.  Chronic cough    Patient Instructions       IF you received an x-ray today, you will receive an invoice from [facility]. Please contact [facility] at [phone] with questions or concerns regarding your invoice.   IF you received labwork  today, you will receive an invoice from [facility]. Please contact [facility] at [phone] with questions or concerns regarding your invoice.   Our billing staff will not be able to assist you with questions regarding bills from these companies.  You will be contacted with the lab results as soon as they are available. The fastest way to get your results is to activate your My Chart account. Instructions are located on the last page of this paperwork. If you have not heard from [place] regarding the results in 2 weeks, please contact this office.     Cough, Adult A cough helps to clear your throat and lungs. A cough may last only 2-3 weeks (acute), or it may last longer than 8 weeks (chronic). Many different things can cause a cough. A cough may be a sign of an illness or another medical condition. Follow these instructions at home:  Pay attention to any  changes in your cough.  Take medicines only as told by your doctor. ? If you were prescribed an antibiotic medicine, take it as told by your doctor. Do not stop taking it even if you start to feel better. ? Talk with your doctor before you try using a cough medicine.  Drink enough fluid to keep your pee (urine) clear or pale yellow.  If the air is dry, use a cold steam vaporizer or humidifier in your home.  Stay away from things that make you cough at work or at home.  If your cough is worse at night, try using extra pillows to raise your head up higher while you sleep.  Do not smoke, and try not to be around smoke. If you need help quitting, ask your doctor.  Do not have caffeine.  Do not drink alcohol.  Rest as needed. Contact a doctor if:  You have new problems (symptoms).  You cough up yellow fluid (pus).  Your cough does not get better after 2-3 weeks, or your cough gets worse.  Medicine does not help your cough and you are not sleeping well.  You have pain that gets worse or pain that is not helped with medicine.  You  have a fever.  You are losing weight and you do not know why.  You have night sweats. Get help right away if:  You cough up blood.  You have trouble breathing.  Your heartbeat is very fast. This information is not intended to replace advice given to you by your health care provider. Make sure you discuss any questions you have with your health care provider. Document Released: 01/01/2011 Document Revised: 09/26/2015 Document Reviewed: 06/27/2014 Elsevier Interactive Patient Education  2018 Elsevier Inc.     Agustina Caroli, MD [facility]

## 2017-06-04 NOTE — Patient Instructions (Addendum)
     IF you received an x-ray today, you will receive an invoice from [facility]. Please contact [facility] at [phone] with questions or concerns regarding your invoice.   IF you received labwork today, you will receive an invoice from [facility]. Please contact [facility] at [phone] with questions or concerns regarding your invoice.   Our billing staff will not be able to assist you with questions regarding bills from these companies.  You will be contacted with the lab results as soon as they are available. The fastest way to get your results is to activate your My Chart account. Instructions are located on the last page of this paperwork. If you have not heard from us regarding the results in 2 weeks, please contact this office.     Cough, Adult A cough helps to clear your throat and lungs. A cough may last only 2-3 weeks (acute), or it may last longer than 8 weeks (chronic). Many different things can cause a cough. A cough may be a sign of an illness or another medical condition. Follow these instructions at home:  Pay attention to any changes in your cough.  Take medicines only as told by your doctor. ? If you were prescribed an antibiotic medicine, take it as told by your doctor. Do not stop taking it even if you start to feel better. ? Talk with your doctor before you try using a cough medicine.  Drink enough fluid to keep your pee (urine) clear or pale yellow.  If the air is dry, use a cold steam vaporizer or humidifier in your home.  Stay away from things that make you cough at work or at home.  If your cough is worse at night, try using extra pillows to raise your head up higher while you sleep.  Do not smoke, and try not to be around smoke. If you need help quitting, ask your doctor.  Do not have caffeine.  Do not drink alcohol.  Rest as needed. Contact a doctor if:  You have new problems (symptoms).  You cough up yellow fluid  (pus).  Your cough does not get better after 2-3 weeks, or your cough gets worse.  Medicine does not help your cough and you are not sleeping well.  You have pain that gets worse or pain that is not helped with medicine.  You have a fever.  You are losing weight and you do not know why.  You have night sweats. Get help right away if:  You cough up blood.  You have trouble breathing.  Your heartbeat is very fast. This information is not intended to replace advice given to you by your health care provider. Make sure you discuss any questions you have with your health care provider. Document Released: 01/01/2011 Document Revised: 09/26/2015 Document Reviewed: 06/27/2014 Elsevier Interactive Patient Education  2018 Elsevier Inc.  

## 2017-06-22 ENCOUNTER — Ambulatory Visit (INDEPENDENT_AMBULATORY_CARE_PROVIDER_SITE_OTHER): Payer: Self-pay | Admitting: Emergency Medicine

## 2017-06-22 ENCOUNTER — Other Ambulatory Visit: Payer: Self-pay

## 2017-06-22 ENCOUNTER — Encounter: Payer: Self-pay | Admitting: Emergency Medicine

## 2017-06-22 VITALS — BP 110/80 | HR 75 | Temp 98.1°F | Resp 16 | Ht 60.0 in | Wt 126.8 lb

## 2017-06-22 DIAGNOSIS — R053 Chronic cough: Secondary | ICD-10-CM

## 2017-06-22 DIAGNOSIS — R05 Cough: Secondary | ICD-10-CM

## 2017-06-22 DIAGNOSIS — J45909 Unspecified asthma, uncomplicated: Secondary | ICD-10-CM

## 2017-06-22 DIAGNOSIS — R1013 Epigastric pain: Secondary | ICD-10-CM

## 2017-06-22 DIAGNOSIS — G8929 Other chronic pain: Secondary | ICD-10-CM

## 2017-06-22 MED ORDER — OMEPRAZOLE 20 MG PO CPDR: 20.0000 mg | DELAYED_RELEASE_CAPSULE | Freq: Every day | ORAL | 3 refills | Status: DC

## 2017-06-22 MED ORDER — BECLOMETHASONE DIPROP HFA 40 MCG/ACT IN AERB: 2.0000 | INHALATION_SPRAY | Freq: Two times a day (BID) | RESPIRATORY_TRACT | 3 refills | Status: DC

## 2017-06-22 NOTE — Progress Notes (Signed)
[place] 37 y.o.   Chief Complaint  Patient presents with  . Cough    FOLLOW UP 2 weeks ago [facility]    HISTORY OF PRESENT ILLNESS: This is a 37 y.o. female here for follow-up.  Seen by me 2 weeks ago.  At that time she was complaining of a chronic cough.  Questionable history of asthma.  Started on antibiotic, prednisone, Tessalon, and albuterol inhaler.  Doing well.  Noticed improvement in the symptoms.  Also complaining of occasional epigastric pain that wakes her up in the early morning hours.  Raising the possibility of GERD.  Could be contributing to her symptoms.  No new symptoms or any other significant symptomatology.  HPI   Prior to Admission medications   Medication Sig Start Date End Date Taking? Authorizing Provider  albuterol (PROVENTIL HFA;VENTOLIN HFA) 108 (90 Base) MCG/ACT inhaler Inhale 2 puffs into the lungs every 6 (six) hours as needed for wheezing or shortness of breath. 06/04/17  Yes Horald Pollen, MD  azithromycin [facility]) 250 MG tablet Sig as indicated Patient not taking: Reported on 06/22/2017 06/04/17   Horald Pollen, MD  benzonatate (TESSALON) 200 MG capsule Take 1 capsule (200 mg total) by mouth 2 (two) times daily as needed for cough. Patient not taking: Reported on 06/22/2017 06/04/17   Horald Pollen, MD  cetirizine (ZYRTEC) 10 MG tablet Take 10 mg by mouth daily.    [provider]    No Known Allergies  Patient Active Problem List   Diagnosis Date Noted  . 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 Lower respiratory infection 06/04/2017  . Hair loss 10/21/2015  . Seborrheic dermatitis 01/28/2015  . Chronic cough 12/13/2013    Past Medical History:  Diagnosis Date  . Depression     Past Surgical History:  Procedure Laterality Date  . CHOLECYSTECTOMY  2007    Social History   Socioeconomic History  . Marital status: Married    Spouse name: Not on file  . Number of children: 2  . Years of education: Not on  file  . Highest education level: Not on file  Social Needs  . Financial resource strain: Not on file  . Food insecurity - worry: Not on file  . Food insecurity - inability: Not on file  . Transportation needs - medical: Not on file  . Transportation needs - non-medical: Not on file  Occupational History  . Occupation: Stay at home mom  Tobacco Use  . Smoking status: Never Smoker  . Smokeless tobacco: Never Used  Substance and Sexual Activity  . Alcohol use: No  . Drug use: No  . Sexual activity: Not on file  Other Topics Concern  . Not on file  Social History Narrative  . Not on file    Family History  Problem Relation Age of Onset  . Hypertension Other   . Diabetes Other      Review of Systems  Constitutional: Negative.  Negative for chills, fever and weight loss.  HENT: Negative.   Eyes: Negative.   Respiratory: Positive for cough. Negative for wheezing.   Cardiovascular: Negative.  Negative for chest pain and palpitations.  Gastrointestinal: Positive for abdominal pain (Epigastric) and heartburn (Intermittent). Negative for blood in stool and melena.  Genitourinary: Negative.  Negative for dysuria.  Skin: Negative.  Negative for rash.  Neurological: Negative.  Negative for dizziness and headaches.  Endo/Heme/Allergies: Negative.   All other systems reviewed and are negative.    Physical Exam  Constitutional: She is oriented to person, place, and time. She appears well-developed and well-nourished.  HENT:  Head: Normocephalic and atraumatic.  Nose: Nose normal.  Mouth/Throat: Oropharynx is clear and moist.  Eyes: Conjunctivae and EOM are normal. Pupils are equal, round, and reactive to light.  Neck: Normal range of motion. Neck supple.  Cardiovascular: Normal rate, regular rhythm and normal heart sounds.  Pulmonary/Chest: Effort normal and breath sounds normal.  Abdominal: Soft. Bowel sounds are normal. She exhibits no distension and no mass. There is no  tenderness.  Musculoskeletal: Normal range of motion.  Neurological: She is alert and oriented to person, place, and time. No sensory deficit. She exhibits normal muscle tone.  Skin: Skin is warm and dry. Capillary refill takes less than 2 seconds. No rash noted.  Psychiatric: She has a normal mood and affect. Her behavior is normal.  Vitals reviewed.  Mild reactive airways disease Stable.  Did not have to use the albuterol inhaler.  Trial of Qvar initiated.  Will refer to pulmonology once patient gets her insurance.  There might be a GERD component.  Will start omeprazole daily for 4 weeks.  We will reevaluate in 4-6 weeks.    ASSESSMENT & PLAN: Jenny [place] was seen today for cough.  Diagnoses and all orders for this visit:  Chronic cough  Mild reactive airways disease, unspecified whether persistent -     beclomethasone (QVAR REDIHALER) 40 MCG/ACT inhaler; Inhale 2 puffs into the lungs 2 (two) times daily.  Abdominal pain, chronic, epigastric Comments: suspected GERD Orders: -     omeprazole (PRILOSEC) 20 MG capsule; Take 1 capsule (20 mg total) by mouth daily.    Patient Instructions       IF you received an x-ray today, you will receive an invoice from [facility]. Please contact [facility] at [phone] with questions or concerns regarding your invoice.   IF you received labwork today, you will receive an invoice from [facility]. Please contact [facility] at [phone] with questions or concerns regarding your invoice.   Our billing staff will not be able to assist you with questions regarding bills from these companies.  You will be contacted with the lab results as soon as they are available. The fastest way to get your results is to activate your My Chart account. Instructions are located on the last page of this paperwork. If you have not heard from [place] regarding the results in 2 weeks, please contact this office.     Asthma, Adult Asthma is a  condition of the lungs in which the airways tighten and narrow. Asthma can make it hard to breathe. Asthma cannot be cured, but medicine and lifestyle changes can help control it. Asthma may be started (triggered) by:  Animal skin flakes (dander).  Dust.  Cockroaches.  Pollen.  Mold.  Smoke.  Cleaning products.  Hair sprays or aerosol sprays.  Paint fumes or strong smells.  Cold air, weather changes, and winds.  Crying or laughing hard.  Stress.  Certain medicines or drugs.  Foods, such as dried fruit, potato chips, and sparkling grape juice.  Infections or conditions (colds, flu).  Exercise.  Certain medical conditions or diseases.  Exercise or tiring activities.  Follow these instructions at home:  Take medicine as told by your doctor.  Use a peak flow meter as told by your doctor. A peak flow meter is a tool that measures how well the lungs are working.  Record and keep track of the peak flow meter's readings.  Understand and use the asthma action plan. An asthma action plan is a written plan for taking care of your asthma and treating your attacks.  To help prevent asthma attacks: ? Do not smoke. Stay away from secondhand smoke. ? Change your heating and air conditioning filter often. ? Limit your use of fireplaces and wood stoves. ? Get rid of pests (such as roaches and mice) and their droppings. ? Throw away plants if you see mold on them. ? Clean your floors. Dust regularly. Use cleaning products that do not smell. ? Have someone vacuum when you are not home. Use a vacuum cleaner with a HEPA filter if possible. ? Replace carpet with wood, tile, or vinyl flooring. Carpet can trap animal skin flakes and dust. ? Use allergy-proof pillows, mattress covers, and box spring covers. ? Wash bed sheets and blankets every week in hot water and dry them in a dryer. ? Use blankets that are made of polyester or cotton. ? Clean bathrooms and kitchens with bleach.  If possible, have someone repaint the walls in these rooms with mold-resistant paint. Keep out of the rooms that are being cleaned and painted. ? Wash hands often. Contact a doctor if:  You have make a whistling sound when breaking (wheeze), have shortness of breath, or have a cough even if taking medicine to prevent attacks.  The colored mucus you cough up (sputum) is thicker than usual.  The colored mucus you cough up changes from clear or white to yellow, green, gray, or bloody.  You have problems from the medicine you are taking such as: ? A rash. ? Itching. ? Swelling. ? Trouble breathing.  You need reliever medicines more than 2-3 times a week.  Your peak flow measurement is still at 50-79% of your personal best after following the action plan for 1 hour.  You have a fever. Get help right away if:  You seem to be worse and are not responding to medicine during an asthma attack.  You are short of breath even at rest.  You get short of breath when doing very little activity.  You have trouble eating, drinking, or talking.  You have chest pain.  You have a fast heartbeat.  Your lips or fingernails start to turn blue.  You are light-headed, dizzy, or faint.  Your peak flow is less than 50% of your personal best. This information is not intended to replace advice given to you by your health care provider. Make sure you discuss any questions you have with your health care provider. Document Released: 10/07/2007 Document Revised: 09/26/2015 Document Reviewed: 11/17/2012 Elsevier Interactive Patient Education  2017 Elsevier Inc.      Agustina Caroli, MD [facility]

## 2017-06-22 NOTE — Assessment & Plan Note (Signed)
Stable.  Did not have to use the albuterol inhaler.  Trial of Qvar initiated.  Will refer to pulmonology once patient gets her insurance.  There might be a GERD component.  Will start omeprazole daily for 4 weeks.  We will reevaluate in 4-6 weeks.

## 2017-06-22 NOTE — Patient Instructions (Addendum)
   IF you received an x-ray today, you will receive an invoice from [facility]. Please contact [facility] at [phone] with questions or concerns regarding your invoice.   IF you received labwork today, you will receive an invoice from [facility]. Please contact [facility] at [phone] with questions or concerns regarding your invoice.   Our billing staff will not be able to assist you with questions regarding bills from these companies.  You will be contacted with the lab results as soon as they are available. The fastest way to get your results is to activate your My Chart account. Instructions are located on the last page of this paperwork. If you have not heard from us regarding the results in 2 weeks, please contact this office.     Cough, Adult A cough helps to clear your throat and lungs. A cough may last only 2-3 weeks (acute), or it may last longer than 8 weeks (chronic). Many different things can cause a cough. A cough may be a sign of an illness or another medical condition. Follow these instructions at home:  Pay attention to any changes in your cough.  Take medicines only as told by your doctor. ? If you were prescribed an antibiotic medicine, take it as told by your doctor. Do not stop taking it even if you start to feel better. ? Talk with your doctor before you try using a cough medicine.  Drink enough fluid to keep your pee (urine) clear or pale yellow.  If the air is dry, use a cold steam vaporizer or humidifier in your home.  Stay away from things that make you cough at work or at home.  If your cough is worse at night, try using extra pillows to raise your head up higher while you sleep.  Do not smoke, and try not to be around smoke. If you need help quitting, ask your doctor.  Do not have caffeine.  Do not drink alcohol.  Rest as needed. Contact a doctor if:  You have new problems (symptoms).  You cough up yellow fluid  (pus).  Your cough does not get better after 2-3 weeks, or your cough gets worse.  Medicine does not help your cough and you are not sleeping well.  You have pain that gets worse or pain that is not helped with medicine.  You have a fever.  You are losing weight and you do not know why.  You have night sweats. Get help right away if:  You cough up blood.  You have trouble breathing.  Your heartbeat is very fast. This information is not intended to replace advice given to you by your health care provider. Make sure you discuss any questions you have with your health care provider. Document Released: 01/01/2011 Document Revised: 09/26/2015 Document Reviewed: 06/27/2014 Elsevier Interactive Patient Education  2018 [employer]  Asthma, Adult Asthma is a condition of the lungs in which the airways tighten and narrow. Asthma can make it hard to breathe. Asthma cannot be cured, but medicine and lifestyle changes can help control it. Asthma may be started (triggered) by:  Animal skin flakes (dander).  Dust.  Cockroaches.  Pollen.  Mold.  Smoke.  Cleaning products.  Hair sprays or aerosol sprays.  Paint fumes or strong smells.  Cold air, weather changes, and winds.  Crying or laughing hard.  Stress.  Certain medicines or drugs.  Foods, such as dried fruit, potato chips, and sparkling grape juice.  Infections or conditions (colds,   flu).  Exercise.  Certain medical conditions or diseases.  Exercise or tiring activities.  Follow these instructions at home:  Take medicine as told by your doctor.  Use a peak flow meter as told by your doctor. A peak flow meter is a tool that measures how well the lungs are working.  Record and keep track of the peak flow meter's readings.  Understand and use the asthma action plan. An asthma action plan is a written plan for taking care of your asthma and treating your attacks.  To help prevent asthma attacks: ? Do not  smoke. Stay away from secondhand smoke. ? Change your heating and air conditioning filter often. ? Limit your use of fireplaces and wood stoves. ? Get rid of pests (such as roaches and mice) and their droppings. ? Throw away plants if you see mold on them. ? Clean your floors. Dust regularly. Use cleaning products that do not smell. ? Have someone vacuum when you are not home. Use a vacuum cleaner with a HEPA filter if possible. ? Replace carpet with wood, tile, or vinyl flooring. Carpet can trap animal skin flakes and dust. ? Use allergy-proof pillows, mattress covers, and box spring covers. ? Wash bed sheets and blankets every week in hot water and dry them in a dryer. ? Use blankets that are made of polyester or cotton. ? Clean bathrooms and kitchens with bleach. If possible, have someone repaint the walls in these rooms with mold-resistant paint. Keep out of the rooms that are being cleaned and painted. ? Wash hands often. Contact a doctor if:  You have make a whistling sound when breaking (wheeze), have shortness of breath, or have a cough even if taking medicine to prevent attacks.  The colored mucus you cough up (sputum) is thicker than usual.  The colored mucus you cough up changes from clear or white to yellow, green, gray, or bloody.  You have problems from the medicine you are taking such as: ? A rash. ? Itching. ? Swelling. ? Trouble breathing.  You need reliever medicines more than 2-3 times a week.  Your peak flow measurement is still at 50-79% of your personal best after following the action plan for 1 hour.  You have a fever. Get help right away if:  You seem to be worse and are not responding to medicine during an asthma attack.  You are short of breath even at rest.  You get short of breath when doing very little activity.  You have trouble eating, drinking, or talking.  You have chest pain.  You have a fast heartbeat.  Your lips or fingernails start to  turn blue.  You are light-headed, dizzy, or faint.  Your peak flow is less than 50% of your personal best. This information is not intended to replace advice given to you by your health care provider. Make sure you discuss any questions you have with your health care provider. Document Released: 10/07/2007 Document Revised: 09/26/2015 Document Reviewed: 11/17/2012 Elsevier Interactive Patient Education  2017 Elsevier Inc.  

## 2017-08-24 ENCOUNTER — Ambulatory Visit: Payer: Medicaid Other | Admitting: Emergency Medicine

## 2018-07-13 ENCOUNTER — Ambulatory Visit: Payer: Medicaid Other | Admitting: Emergency Medicine

## 2018-07-19 ENCOUNTER — Ambulatory Visit: Payer: Medicaid Other | Admitting: Family Medicine

## 2018-07-19 ENCOUNTER — Other Ambulatory Visit: Payer: Self-pay

## 2018-07-19 ENCOUNTER — Ambulatory Visit: Payer: Self-pay | Admitting: *Deleted

## 2018-07-19 NOTE — Telephone Encounter (Signed)
Summary: please advise   Pt only want to see dr sagardia. Pt has been having face and hand swelling for over a month. Pt had an appt on 07-13-2018 and cancelled. Pt also still has a cough for over 2 years. Please advise     Patient is having bruising in R arm- she states the bruising gets lighter with circulation during the day, patient reports she has swelling in hand also. Patient has swelling in face- eyes puffy and face puffy- patient reports the hand is better- but the face is still swollen.  No numbness reported- patient is reporting pain upper arm where bruising is- down to wrist.  Patient reports sometimes she sees the discoloration on the L arm.  Patient went to [facility] to old provider and had labs- she was told she had iron count. Patient states she has been taking iron for about a month now. Patient will bring her lab work and any other studies with her.  Patient seems to have increasing symptoms and the pain in arms is new for her.  Reason for Disposition . [1] After 10 days AND [2] bruise not fading  Answer Assessment - Initial Assessment Questions 1. APPEARANCE of BRUISE: "Describe the bruise."      R arm and sometimes in L arm 2. SIZE: "How large is the bruise?"      Size of 2 quarters 3. NUMBER: "How many bruises are there?"      1 bruise- many be in other areas 4. LOCATION: "Where is the bruise located?"      Above the elbow on the inside of the arm 5. ONSET: "How long ago did the bruise occur?"      6 months ago 6. CAUSE: "Tell me how it happened."     Unknown- patient states she is having pain 7. MEDICAL HISTORY: "Do you have any medical problems that can cause easy bruising or bleeding?" (e.g., leukemia, liver disease, recent chemotherapy)     no 8. MEDICATIONS : "Do you take any medications which thin the blood such as: aspirin, heparin, ibuprofen (NSAIDS), Plavix, or Coumadin?"     no 9. OTHER SYMPTOMS: "Do you have any other symptoms?"  (e.g., weakness, dizziness,  pain, fever, nosebleed, blood in urine/stool)     Fatigue, headaches- daily 10. PREGNANCY: "Is there any chance you are pregnant?" "When was your last menstrual period?"       No- LMP- 06/26/18  Protocols used: Guadalupe Maple

## 2020-04-12 ENCOUNTER — Emergency Department (HOSPITAL_COMMUNITY)
Admission: EM | Admit: 2020-04-12 | Discharge: 2020-04-13 | Disposition: A | Discharge status: Home / Self Care | Payer: Medicaid Other | Attending: Emergency Medicine | Admitting: Emergency Medicine

## 2020-04-12 ENCOUNTER — Encounter (HOSPITAL_COMMUNITY): Payer: Self-pay

## 2020-04-12 ENCOUNTER — Other Ambulatory Visit: Payer: Self-pay

## 2020-04-12 DIAGNOSIS — N938 Other specified abnormal uterine and vaginal bleeding: Secondary | ICD-10-CM | POA: Insufficient documentation

## 2020-04-12 DIAGNOSIS — R42 Dizziness and giddiness: Secondary | ICD-10-CM | POA: Insufficient documentation

## 2020-04-12 DIAGNOSIS — R1030 Lower abdominal pain, unspecified: Secondary | ICD-10-CM | POA: Insufficient documentation

## 2020-04-12 LAB — COMPREHENSIVE METABOLIC PANEL
ALT: 21 U/L (ref 0–44)
AST: 21 U/L (ref 15–41)
Albumin: 4 g/dL (ref 3.5–5.0)
Alkaline Phosphatase: 55 U/L (ref 38–126)
Anion gap: 9 (ref 5–15)
BUN: 15 mg/dL (ref 6–20)
CO2: 22 mmol/L (ref 22–32)
Calcium: 8.6 mg/dL — ABNORMAL LOW (ref 8.9–10.3)
Chloride: 106 mmol/L (ref 98–111)
Creatinine, Ser: 0.63 mg/dL (ref 0.44–1.00)
GFR, Estimated: >60 mL/min (ref >60)
Glucose, Bld: 103 mg/dL — ABNORMAL HIGH (ref 70–99)
Potassium: 3.7 mmol/L (ref 3.5–5.1)
Sodium: 137 mmol/L (ref 135–145)
Total Bilirubin: 0.7 mg/dL (ref 0.3–1.2)
Total Protein: 7.2 g/dL (ref 6.5–8.1)

## 2020-04-12 LAB — I-STAT BETA HCG BLOOD, ED (MC, WL, AP ONLY): I-stat hCG, quantitative: <5 m[IU]/mL (ref <5)

## 2020-04-12 LAB — CBC WITH DIFFERENTIAL/PLATELET
Abs Immature Granulocytes: 0.01 10*3/uL (ref 0.00–0.07)
Basophils Absolute: 0 10*3/uL (ref 0.0–0.1)
Basophils Relative: 1 %
Eosinophils Absolute: 0.1 10*3/uL (ref 0.0–0.5)
Eosinophils Relative: 2 %
HCT: 30.5 % — ABNORMAL LOW (ref 36.0–46.0)
Hemoglobin: 9.6 g/dL — ABNORMAL LOW (ref 12.0–15.0)
Immature Granulocytes: 0 %
Lymphocytes Relative: 49 %
Lymphs Abs: 2.2 10*3/uL (ref 0.7–4.0)
MCH: 25.8 pg — ABNORMAL LOW (ref 26.0–34.0)
MCHC: 31.5 g/dL (ref 30.0–36.0)
MCV: 82 fL (ref 80.0–100.0)
Monocytes Absolute: 0.3 10*3/uL (ref 0.1–1.0)
Monocytes Relative: 8 %
Neutro Abs: 1.7 10*3/uL (ref 1.7–7.7)
Neutrophils Relative %: 40 %
Platelets: 270 10*3/uL (ref 150–400)
RBC: 3.72 MIL/uL — ABNORMAL LOW (ref 3.87–5.11)
RDW: 15.3 % (ref 11.5–15.5)
WBC: 4.4 10*3/uL (ref 4.0–10.5)
nRBC: 0 % (ref 0.0–0.2)

## 2020-04-12 NOTE — ED Triage Notes (Signed)
Pt reports sudden heavy vaginal bleeding beginning at 1800 tonight. Pt says she has saturated 3 pads since.

## 2020-04-13 MED ORDER — FERROUS SULFATE 325 (65 FE) MG PO TABS: 325.0000 mg | ORAL_TABLET | Freq: Every day | ORAL | 0 refills | Status: DC

## 2020-04-13 NOTE — ED Provider Notes (Signed)
Jenny Wagner Provider Note   CSN: [id number] Arrival date & time: 04/12/20  2124     History Chief Complaint  Patient presents with  . Vaginal Bleeding    Jenny Wagner is a 39 y.o. female.  Patient to ED with excessive vaginal bleeding x 1 day. She started her menses at the regular time one week ago and states that per her pattern, she anticipated that it would end today. Instead, bleeding became worse. She reports intermittent, mild lightheadedness through the week but no syncope or near syncope. She has lower abdominal cramping. The bleeding today involved passing large clots. No fever, vaginal discharge, urinary symptoms. She reports a family history of endometriosis.   The history is provided by the patient. No language interpreter was used.  Vaginal Bleeding Associated symptoms: no fever        Past Medical History:  Diagnosis Date  . Depression     Patient Active Problem List   Diagnosis Date Noted  . Abdominal pain, chronic, epigastric 06/22/2017  . 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 Lower respiratory infection 06/04/2017  . Hair loss 10/21/2015  . Seborrheic dermatitis 01/28/2015  . Chronic cough 12/13/2013    Past Surgical History:  Procedure Laterality Date  . CHOLECYSTECTOMY  2007     OB History   No obstetric history on file.     Family History  Problem Relation Age of Onset  . Hypertension Other   . Diabetes Other     Social History   Tobacco Use  . Smoking status: Never Smoker  . Smokeless tobacco: Never Used  Substance Use Topics  . Alcohol use: No  . Drug use: No    Home Medications Prior to Admission medications   Medication Sig Start Date End Date Taking? Authorizing Provider  albuterol (PROVENTIL HFA;VENTOLIN HFA) 108 (90 Base) MCG/ACT inhaler Inhale 2 puffs into the lungs every 6 (six) hours as needed for wheezing or shortness of breath. 06/04/17   Horald Pollen, MD   azithromycin [facility]) 250 MG tablet Sig as indicated Patient not taking: Reported on 06/22/2017 06/04/17   Horald Pollen, MD  beclomethasone (QVAR REDIHALER) 40 MCG/ACT inhaler Inhale 2 puffs into the lungs 2 (two) times daily. 06/22/17   Horald Pollen, MD  benzonatate (TESSALON) 200 MG capsule Take 1 capsule (200 mg total) by mouth 2 (two) times daily as needed for cough. Patient not taking: Reported on 06/22/2017 06/04/17   Horald Pollen, MD  cetirizine (ZYRTEC) 10 MG tablet Take 10 mg by mouth daily.    [provider]  omeprazole (PRILOSEC) 20 MG capsule Take 1 capsule (20 mg total) by mouth daily. 06/22/17   Horald Pollen, MD    Allergies    Patient has no known allergies.  Review of Systems   Review of Systems  Constitutional: Negative for chills and fever.  HENT: Negative.   Respiratory: Negative.  Negative for shortness of breath.   Cardiovascular: Negative.  Negative for chest pain.  Gastrointestinal: Negative.  Negative for vomiting.  Genitourinary: Positive for menstrual problem, pelvic pain and vaginal bleeding.  Musculoskeletal: Negative.   Skin: Negative.  Negative for pallor.  Neurological: Positive for light-headedness. Negative for syncope.    Physical Exam Updated Vital Signs BP 124/78   Pulse 85   Temp 97.9 F (36.6 C) (Oral)   Resp 17   Ht 5' (1.524 m)   Wt 59 kg   SpO2 100%   BMI 25.39  kg/m   Physical Exam Vitals and nursing note reviewed.  Constitutional:      Appearance: She is well-developed and well-nourished.  Eyes:     Comments: No conjunctival pallor.  Pulmonary:     Effort: Pulmonary effort is normal.  Abdominal:     Comments: Mild lower abdominal tenderness to soft abdomen.   Genitourinary:    Comments: Cervial bleeding, moderate. No clots visualized. No cervical friability. There is a large firm mass to the left of cervix c/w possible fibroid. No discharge. Normal external vaginal exam.   Musculoskeletal:        General: Normal range of motion.     Cervical back: Normal range of motion.  Skin:    General: Skin is warm and dry.  Neurological:     Mental Status: She is alert and oriented to person, place, and time.     ED Results / Procedures / Treatments   Labs (all labs ordered are listed, but only abnormal results are displayed) Labs Reviewed  CBC WITH DIFFERENTIAL/PLATELET - Abnormal; Notable for the following components:      Result Value   RBC 3.72 (*)    Hemoglobin 9.6 (*)    HCT 30.5 (*)    MCH 25.8 (*)    All other components within normal limits  COMPREHENSIVE METABOLIC PANEL - Abnormal; Notable for the following components:   Glucose, Bld 103 (*)    Calcium 8.6 (*)    All other components within normal limits  I-STAT BETA HCG BLOOD, ED ([facility], [facility], [facility] ONLY)    EKG None  Radiology No results found.  Procedures Procedures (including critical care time)  Medications Ordered in ED Medications - No data to display  ED Course  I have reviewed the triage vital signs and the nursing notes.  Pertinent labs & imaging results that were available during my care of the patient were reviewed by me and considered in my medical decision making (see chart for details).    MDM Rules/Calculators/A&P                          Patient to ED with abnormal vaginal bleeding associated with her menses. Lightheadedness without syncope.   VSS. She appears hemodynamically stable. Suspect uterine fibroid based on exam. Ultrasound felt appropriate to obtain in the outpatient setting. Will start iron supplement. Refer to GYN.   This plan was discussed with attending, Dr. Florina Ou, who agrees with plan of care.   Final Clinical Impression(s) / ED Diagnoses Final diagnoses:  None   1. Dysfunctional uterine bleeding  Rx / DC Orders ED Discharge Orders    None       Charlann Lange, Hershal Coria 04/13/20 4128    Molpus, Jenny Reichmann, MD 04/13/20 0222

## 2020-04-13 NOTE — Discharge Instructions (Signed)
You can be discharged home and will need to follow up in the outpatient setting with gynecology. Two clinics have been provided as resources, but you can be seen by a provider of your choice.   Take the iron supplements to improve your hemoglobin. If bleeding worsens, you have worsening lightheadedness or passing out, please return to the emergency department. [facility] ED is recommended as there are emergent GYN services if needed.

## 2020-06-04 ENCOUNTER — Encounter: Payer: Self-pay | Admitting: Emergency Medicine

## 2020-06-04 ENCOUNTER — Ambulatory Visit (INDEPENDENT_AMBULATORY_CARE_PROVIDER_SITE_OTHER): Payer: 59 | Admitting: Emergency Medicine

## 2020-06-04 ENCOUNTER — Other Ambulatory Visit: Payer: Self-pay

## 2020-06-04 VITALS — BP 158/84 | HR 67 | Temp 98.4°F | Resp 16 | Ht 60.0 in | Wt 142.0 lb

## 2020-06-04 DIAGNOSIS — R1319 Other dysphagia: Secondary | ICD-10-CM

## 2020-06-04 DIAGNOSIS — N938 Other specified abnormal uterine and vaginal bleeding: Secondary | ICD-10-CM | POA: Diagnosis not present

## 2020-06-04 DIAGNOSIS — M549 Dorsalgia, unspecified: Secondary | ICD-10-CM

## 2020-06-04 DIAGNOSIS — R053 Chronic cough: Secondary | ICD-10-CM

## 2020-06-04 DIAGNOSIS — K219 Gastro-esophageal reflux disease without esophagitis: Secondary | ICD-10-CM

## 2020-06-04 MED ORDER — CYCLOBENZAPRINE HCL 10 MG PO TABS: 10.0000 mg | ORAL_TABLET | Freq: Every day | ORAL | 1 refills | Status: DC

## 2020-06-04 NOTE — Progress Notes (Signed)
[place] 40 y.o.   Chief Complaint  Patient presents with  . Back Pain    Per patient upper and radiates down to the right side for 6 months  . Shoulder Pain    Per patient right side for 5 months    HISTORY OF PRESENT ILLNESS: This is a 40 y.o. female here to establish care with me again after a change of insurance.  My last visit was in 2019. Was recently seen in the emergency room last December for dysfunctional uterine bleeding.  Needs GYN referral. Today complaining of 2-month history of intermittent pain to right upper and mid back.  Denies injuries.  No associated symptoms. Has history of cholecystectomy in the past. Has history of chronic cough with intermittent trouble swallowing.  Needs GI referral.  Tried omeprazole in the past with some improvement of chronic cough. Not vaccinated against Covid. No other complaints or medical concerns today.  HPI   Prior to Admission medications   Medication Sig Start Date End Date Taking? Authorizing Provider  albuterol (PROVENTIL HFA;VENTOLIN HFA) 108 (90 Base) MCG/ACT inhaler Inhale 2 puffs into the lungs every 6 (six) hours as needed for wheezing or shortness of breath. Patient not taking: Reported on 06/04/2020 06/04/17   Horald Pollen, MD  beclomethasone (QVAR REDIHALER) 40 MCG/ACT inhaler Inhale 2 puffs into the lungs 2 (two) times daily. Patient not taking: Reported on 06/04/2020 06/22/17   Horald Pollen, MD  ferrous sulfate 325 (65 FE) MG tablet Take 1 tablet (325 mg total) by mouth daily. Patient not taking: Reported on 06/04/2020 04/13/20   Charlann Lange, PA-C    No Known Allergies  Patient Active Problem List   Diagnosis Date Noted  . 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 Seborrheic dermatitis 01/28/2015    Past Medical History:  Diagnosis Date  . Depression     Past Surgical History:  Procedure Laterality Date  . CHOLECYSTECTOMY  2007    Social History   Socioeconomic History  .  Marital status: Married    Spouse name: Not on file  . Number of children: 2  . Years of education: Not on file  . Highest education level: Not on file  Occupational History  . Occupation: Stay at home mom  Tobacco Use  . Smoking status: Never Smoker  . Smokeless tobacco: Never Used  Substance and Sexual Activity  . Alcohol use: No  . Drug use: No  . Sexual activity: Not on file  Other Topics Concern  . Not on file  Social History Narrative  . Not on file   Social Determinants of Health   Financial Resource Strain: Not on file  Food Insecurity: Not on file  Transportation Needs: Not on file  Physical Activity: Not on file  Stress: Not on file  Social Connections: Not on file  Intimate Partner Violence: Not on file    Family History  Problem Relation Age of Onset  . Hypertension Other   . Diabetes Other      Review of Systems  Constitutional: Negative.  Negative for chills and fever.  HENT: Negative.  Negative for congestion and sore throat.   Respiratory: Positive for cough (Chronic cough). Negative for shortness of breath.   Cardiovascular: Negative.  Negative for chest pain and palpitations.  Gastrointestinal: Positive for heartburn. Negative for abdominal pain, blood in stool, diarrhea, melena, nausea and vomiting.       Trouble swallowing  Musculoskeletal: Positive for back pain and neck pain.  Skin: Negative.  Negative for rash.  Neurological: Negative.  Negative for dizziness and headaches.  All other systems reviewed and are negative.   Today's Vitals   06/04/20 1038  BP: (!) 158/84  Pulse: 67  Resp: 16  Temp: 98.4 F (36.9 C)  TempSrc: Temporal  SpO2: 100%  Weight: 142 lb (64.4 kg)  Height: 5' (1.524 m)   Body mass index is 27.73 kg/m.  Physical Exam Vitals reviewed.  Constitutional:      Appearance: Normal appearance.  HENT:     Head: Normocephalic.  Eyes:     Extraocular Movements: Extraocular movements intact.     Pupils: Pupils are  equal, round, and reactive to light.  Cardiovascular:     Rate and Rhythm: Normal rate and regular rhythm.     Pulses: Normal pulses.     Heart sounds: Normal heart sounds.  Pulmonary:     Effort: Pulmonary effort is normal.     Breath sounds: Normal breath sounds.  Abdominal:     General: There is no distension.     Palpations: Abdomen is soft.     Tenderness: There is no abdominal tenderness.  Musculoskeletal:        General: Normal range of motion.     Cervical back: Normal range of motion and neck supple.  Skin:    General: Skin is warm and dry.     Capillary Refill: Capillary refill takes less than 2 seconds.  Neurological:     Mental Status: She is alert and oriented to person, place, and time.  Psychiatric:        Mood and Affect: Mood normal.        Behavior: Behavior normal.      ASSESSMENT & PLAN: Clinically stable.  No red flag signs or symptoms. Angalina was seen today for back pain and shoulder pain.  Diagnoses and all orders for this visit:  Musculoskeletal back pain -     cyclobenzaprine (FLEXERIL) 10 MG tablet; Take 1 tablet (10 mg total) by mouth at bedtime.  Esophageal dysphagia -     Ambulatory referral to Gastroenterology  GERD without esophagitis -     Ambulatory referral to Gastroenterology  Dysfunctional uterine bleeding -     Ambulatory referral to Gynecology  Chronic cough    Patient Instructions       If you have lab work done today you will be contacted with your lab results within the next 2 weeks.  If you have not heard from [place] then please contact us. The fastest way to get your results is to register for My Chart.   IF you received an x-ray today, you will receive an invoice from [facility]. Please contact [facility] at [phone] with questions or concerns regarding your invoice.   IF you received labwork today, you will receive an invoice from [place]. Please contact [facility] at [phone] with  questions or concerns regarding your invoice.   Our billing staff will not be able to assist you with questions regarding bills from these companies.  You will be contacted with the lab results as soon as they are available. The fastest way to get your results is to activate your My Chart account. Instructions are located on the last page of this paperwork. If you have not heard from [place] regarding the results in 2 weeks, please contact this office.     Mantenimiento de Technical sales engineer en [place], Female Adoptar un estilo de vida saludable y  recibir atencin preventiva son importantes para Theatre stage manager salud y Musician. Consulte al mdico sobre:  El esquema adecuado para hacerse pruebas y exmenes peridicos.  Cosas que puede hacer por su cuenta para prevenir enfermedades y [employer]. Qu debo saber sobre la dieta, el peso y el ejercicio? Consuma una dieta saludable  Consuma una dieta que incluya muchas verduras, frutas, productos lcteos con bajo contenido de [place] y Advertising account planner.  No consuma muchos alimentos ricos en grasas slidas, azcares agregados o sodio.   Mantenga un peso saludable El ndice de masa muscular [facility]) se [place] para identificar problemas de [place]. Proporciona una estimacin de la grasa corporal basndose en el peso y la altura. Su mdico puede ayudarle a Radiation protection practitioner [place] y a Scientist, forensic o Theatre manager un peso saludable. Haga ejercicio con regularidad Haga ejercicio con regularidad. Esta es una de las prcticas ms importantes que puede hacer por su salud. La mayora de los adultos deben seguir estas pautas:  Optometrist, al menos, 145minutos de actividad fsica por semana. El ejercicio debe aumentar la frecuencia cardaca y Nature conservation officer transpirar (ejercicio de intensidad moderada).  Hacer ejercicios de fortalecimiento por lo [employer] por semana. Agregue esto a su plan de ejercicio de intensidad moderada.  Pasar menos tiempo sentados. Incluso la  actividad fsica ligera puede ser beneficiosa. Controle sus niveles de colesterol y lpidos en la sangre Comience a realizarse anlisis de lpidos y Research officer, trade union en la sangre a los 20aos y luego reptalos cada 5aos. Hgase controlar los niveles de colesterol con mayor frecuencia si:  Sus niveles de lpidos y colesterol son altos.  Es mayor de 40aos.  Presenta un alto riesgo de padecer enfermedades cardacas. Qu debo saber sobre las pruebas de deteccin del cncer? Segn su historia clnica y sus antecedentes familiares, es posible que deba realizarse pruebas de deteccin del cncer en diferentes edades. Esto puede incluir pruebas de deteccin de lo siguiente:  Cncer de mama.  Cncer de cuello uterino.  Cncer colorrectal.  Cncer de piel.  Cncer de pulmn. Qu debo saber sobre la enfermedad cardaca, la diabetes y la hipertensin arterial? Presin arterial y enfermedad cardaca  La hipertensin arterial causa enfermedades cardacas y [place] el riesgo de accidente cerebrovascular. Es ms probable que esto se manifieste en las personas que tienen lecturas de presin arterial alta, tienen ascendencia africana o tienen sobrepeso.  Hgase controlar la presin arterial: ? Cada 3 a 5 aos si tiene entre 18 y 62 aos. ? Todos los aos si es mayor de [place]. Diabetes Realcese exmenes de deteccin de la diabetes con regularidad. Este anlisis revisa el nivel de azcar en la sangre en [place]. Hgase las pruebas de deteccin:  Cada tresaos despus de los 41aos de edad si tiene un peso normal y un bajo riesgo de padecer diabetes.  Con ms frecuencia y a partir de [place] edad inferior si tiene sobrepeso o un alto riesgo de padecer diabetes. Qu debo saber sobre la prevencin de infecciones? Hepatitis B Si tiene un riesgo ms alto de contraer hepatitis B, debe someterse a un examen de deteccin de este virus. Hable con el mdico para averiguar si tiene riesgo de contraer la  infeccin por hepatitis B. Hepatitis C Se recomienda el anlisis a:  [employer] 1945 y 1965.  Todas las personas que tengan un riesgo de haber contrado hepatitis C. Enfermedades de transmisin sexual (ETS)  Hgase las pruebas de deteccin de ITS, incluidas la gonorrea y la clamidia, si: ? Es sexualmente  Rosealee Albee y es menor de [place]. ? Es mayor de 24aos, y Investment banker, operational informa que corre riesgo de tener este tipo de infecciones. ? La actividad sexual ha cambiado desde que le hicieron la ltima prueba de deteccin y tiene un riesgo mayor de Best boy clamidia o Radio broadcast assistant. Pregntele al mdico si usted tiene riesgo.  Pregntele al mdico si usted tiene un alto riesgo de Museum/gallery curator VIH. El mdico tambin puede recomendarle un medicamento recetado para ayudar a evitar la infeccin por el VIH. Si elige tomar medicamentos para prevenir el VIH, primero debe [employer] de deteccin del VIH. Luego debe hacerse anlisis cada 79meses mientras est tomando los medicamentos. Embarazo  Si est por dejar de Librarian, academic (fase premenopusica) y usted puede quedar [place], busque asesoramiento antes de [place].  Tome de 400 a [id number] (mcg) de cido [employer] si [place].  Pida mtodos de control de la natalidad (anticonceptivos) si desea evitar un embarazo no deseado. Osteoporosis y [place] La osteoporosis es una enfermedad en la que los huesos pierden los minerales y la fuerza por el avance de la edad. El resultado pueden ser fracturas en los [place]. Si tiene 65aos o ms, o si est en riesgo de sufrir osteoporosis y fracturas, pregunte a su mdico si debe:  Hacerse pruebas de deteccin de prdida sea.  Tomar un suplemento de calcio o de vitamina D para reducir el riesgo de fracturas.  Recibir terapia de reemplazo hormonal (TRH) para tratar los sntomas de la menopausia. Siga estas instrucciones en su casa: Estilo de vida  No consuma  ningn producto que contenga nicotina o tabaco, como cigarrillos, cigarrillos electrnicos y tabaco de Higher education careers adviser. Si necesita ayuda para dejar de fumar, consulte al mdico.  No consuma drogas.  No comparta agujas.  Solicite ayuda a su mdico si necesita apoyo o informacin para abandonar las drogas. Consumo de alcohol  No beba alcohol si: ? Su mdico le indica no hacerlo. ? Est embarazada, puede estar embarazada o est tratando de quedar embarazada.  Si bebe alcohol: ? Limite la cantidad que consume de 0 a 1 medida por da. ? Limite la ingesta si est amamantando.  Est atento a la cantidad de alcohol que hay en las bebidas que toma. En los [place], una medida equivale a una botella de cerveza de 12oz (320ml), un vaso de vino de 5oz (137ml) o un vaso de una bebida alcohlica de alta graduacin de 1oz (5ml). Instrucciones generales  Realcese los estudios de rutina de la salud, dentales y de Public librarian.  [place].  Infrmele a su mdico si: ? Se siente deprimida con frecuencia. ? Alguna vez ha sido vctima de [place] o no se siente segura en su casa. Resumen  Adoptar un estilo de vida saludable y recibir atencin preventiva son importantes para promover la salud y Musician.  Siga las instrucciones del mdico acerca de una dieta saludable, el ejercicio y la realizacin de pruebas o exmenes para Engineer, building services.  Siga las instrucciones del mdico con respecto al control del colesterol y la presin arterial. Esta informacin no tiene Marine scientist el consejo del mdico. Asegrese de hacerle al mdico cualquier pregunta que tenga. Document Revised: 05/11/2018 Document Reviewed: 05/11/2018 Elsevier Patient Education  2021 Elsevier Inc.      Agustina Caroli, MD [facility]

## 2020-06-04 NOTE — Patient Instructions (Addendum)
   If you have lab work done today you will be contacted with your lab results within the next 2 weeks.  If you have not heard from us then please contact us. The fastest way to get your results is to register for My Chart.   IF you received an x-ray today, you will receive an invoice from [facility]. Please contact [facility] at [phone] with questions or concerns regarding your invoice.   IF you received labwork today, you will receive an invoice from [facility]. Please contact [facility] at [phone] with questions or concerns regarding your invoice.   Our billing staff will not be able to assist you with questions regarding bills from these companies.  You will be contacted with the lab results as soon as they are available. The fastest way to get your results is to activate your My Chart account. Instructions are located on the last page of this paperwork. If you have not heard from us regarding the results in 2 weeks, please contact this office.      Mantenimiento de la salud en las mujeres Health Maintenance, Female Adoptar un estilo de vida saludable y recibir atencin preventiva son importantes para promover la salud y el bienestar. Consulte al mdico sobre:  El esquema adecuado para hacerse pruebas y exmenes peridicos.  Cosas que puede hacer por su cuenta para prevenir enfermedades y mantenerse sana. Qu debo saber sobre la dieta, el peso y el ejercicio? Consuma una dieta saludable  Consuma una dieta que incluya muchas verduras, frutas, productos lcteos con bajo contenido de grasa y protenas magras.  No consuma muchos alimentos ricos en grasas slidas, azcares agregados o sodio.   Mantenga un peso saludable El ndice de masa muscular (IMC) se utiliza para identificar problemas de peso. Proporciona una estimacin de la grasa corporal basndose en el peso y la altura. Su mdico puede ayudarle a determinar su IMC y a lograr o mantener un peso  saludable. Haga ejercicio con regularidad Haga ejercicio con regularidad. Esta es una de las prcticas ms importantes que puede hacer por su salud. La mayora de los adultos deben seguir estas pautas:  Realizar, al menos, 150minutos de actividad fsica por semana. El ejercicio debe aumentar la frecuencia cardaca y hacerlo transpirar (ejercicio de intensidad moderada).  Hacer ejercicios de fortalecimiento por lo menos dos veces por semana. Agregue esto a su plan de ejercicio de intensidad moderada.  Pasar menos tiempo sentados. Incluso la actividad fsica ligera puede ser beneficiosa. Controle sus niveles de colesterol y lpidos en la sangre Comience a realizarse anlisis de lpidos y colesterol en la sangre a los 20aos y luego reptalos cada 5aos. Hgase controlar los niveles de colesterol con mayor frecuencia si:  Sus niveles de lpidos y colesterol son altos.  Es mayor de 40aos.  Presenta un alto riesgo de padecer enfermedades cardacas. Qu debo saber sobre las pruebas de deteccin del cncer? Segn su historia clnica y sus antecedentes familiares, es posible que deba realizarse pruebas de deteccin del cncer en diferentes edades. Esto puede incluir pruebas de deteccin de lo siguiente:  Cncer de mama.  Cncer de cuello uterino.  Cncer colorrectal.  Cncer de piel.  Cncer de pulmn. Qu debo saber sobre la enfermedad cardaca, la diabetes y la hipertensin arterial? Presin arterial y enfermedad cardaca  La hipertensin arterial causa enfermedades cardacas y aumenta el riesgo de accidente cerebrovascular. Es ms probable que esto se manifieste en las personas que tienen lecturas de presin arterial alta, tienen ascendencia   africana o tienen sobrepeso.  Hgase controlar la presin arterial: ? Cada 3 a 5 aos si tiene entre 18 y 39 aos. ? Todos los aos si es mayor de 40aos. Diabetes Realcese exmenes de deteccin de la diabetes con regularidad. Este  anlisis revisa el nivel de azcar en la sangre en ayunas. Hgase las pruebas de deteccin:  Cada tresaos despus de los 40aos de edad si tiene un peso normal y un bajo riesgo de padecer diabetes.  Con ms frecuencia y a partir de una edad inferior si tiene sobrepeso o un alto riesgo de padecer diabetes. Qu debo saber sobre la prevencin de infecciones? Hepatitis B Si tiene un riesgo ms alto de contraer hepatitis B, debe someterse a un examen de deteccin de este virus. Hable con el mdico para averiguar si tiene riesgo de contraer la infeccin por hepatitis B. Hepatitis C Se recomienda el anlisis a:  Todos los que nacieron entre 1945 y 1965.  Todas las personas que tengan un riesgo de haber contrado hepatitis C. Enfermedades de transmisin sexual (ETS)  Hgase las pruebas de deteccin de ITS, incluidas la gonorrea y la clamidia, si: ? Es sexualmente activa y es menor de 24aos. ? Es mayor de 24aos, y el mdico le informa que corre riesgo de tener este tipo de infecciones. ? La actividad sexual ha cambiado desde que le hicieron la ltima prueba de deteccin y tiene un riesgo mayor de tener clamidia o gonorrea. Pregntele al mdico si usted tiene riesgo.  Pregntele al mdico si usted tiene un alto riesgo de contraer VIH. El mdico tambin puede recomendarle un medicamento recetado para ayudar a evitar la infeccin por el VIH. Si elige tomar medicamentos para prevenir el VIH, primero debe hacerse los anlisis de deteccin del VIH. Luego debe hacerse anlisis cada 3meses mientras est tomando los medicamentos. Embarazo  Si est por dejar de menstruar (fase premenopusica) y usted puede quedar embarazada, busque asesoramiento antes de quedar embarazada.  Tome de 400 a 800microgramos (mcg) de cido flico todos los das si queda embarazada.  Pida mtodos de control de la natalidad (anticonceptivos) si desea evitar un embarazo no deseado. Osteoporosis y menopausia La  osteoporosis es una enfermedad en la que los huesos pierden los minerales y la fuerza por el avance de la edad. El resultado pueden ser fracturas en los huesos. Si tiene 65aos o ms, o si est en riesgo de sufrir osteoporosis y fracturas, pregunte a su mdico si debe:  Hacerse pruebas de deteccin de prdida sea.  Tomar un suplemento de calcio o de vitamina D para reducir el riesgo de fracturas.  Recibir terapia de reemplazo hormonal (TRH) para tratar los sntomas de la menopausia. Siga estas instrucciones en su casa: Estilo de vida  No consuma ningn producto que contenga nicotina o tabaco, como cigarrillos, cigarrillos electrnicos y tabaco de mascar. Si necesita ayuda para dejar de fumar, consulte al mdico.  No consuma drogas.  No comparta agujas.  Solicite ayuda a su mdico si necesita apoyo o informacin para abandonar las drogas. Consumo de alcohol  No beba alcohol si: ? Su mdico le indica no hacerlo. ? Est embarazada, puede estar embarazada o est tratando de quedar embarazada.  Si bebe alcohol: ? Limite la cantidad que consume de 0 a 1 medida por da. ? Limite la ingesta si est amamantando.  Est atento a la cantidad de alcohol que hay en las bebidas que toma. En los [place], una medida equivale a una botella de cerveza de   12oz (355ml), un vaso de vino de 5oz (148ml) o un vaso de una bebida alcohlica de alta graduacin de 1oz (44ml). Instrucciones generales  Realcese los estudios de rutina de la salud, dentales y de la vista.  Mantngase al da con las vacunas.  Infrmele a su mdico si: ? Se siente deprimida con frecuencia. ? Alguna vez ha sido vctima de maltrato o no se siente segura en su casa. Resumen  Adoptar un estilo de vida saludable y recibir atencin preventiva son importantes para promover la salud y el bienestar.  Siga las instrucciones del mdico acerca de una dieta saludable, el ejercicio y la realizacin de pruebas o exmenes  para detectar enfermedades.  Siga las instrucciones del mdico con respecto al control del colesterol y la presin arterial. Esta informacin no tiene como fin reemplazar el consejo del mdico. Asegrese de hacerle al mdico cualquier pregunta que tenga. Document Revised: 05/11/2018 Document Reviewed: 05/11/2018 Elsevier Patient Education  2021 Elsevier Inc.  

## 2020-09-13 ENCOUNTER — Emergency Department (HOSPITAL_COMMUNITY)
Admission: EM | Admit: 2020-09-13 | Discharge: 2020-09-13 | Disposition: A | Discharge status: Home / Self Care | Payer: 59 | Attending: Emergency Medicine | Admitting: Emergency Medicine

## 2020-09-13 ENCOUNTER — Other Ambulatory Visit: Payer: Self-pay

## 2020-09-13 ENCOUNTER — Encounter (HOSPITAL_COMMUNITY): Payer: Self-pay

## 2020-09-13 ENCOUNTER — Telehealth: Payer: Self-pay | Admitting: Emergency Medicine

## 2020-09-13 DIAGNOSIS — J45909 Unspecified asthma, uncomplicated: Secondary | ICD-10-CM | POA: Diagnosis not present

## 2020-09-13 DIAGNOSIS — R1011 Right upper quadrant pain: Secondary | ICD-10-CM | POA: Diagnosis not present

## 2020-09-13 LAB — COMPREHENSIVE METABOLIC PANEL
ALT: 23 U/L (ref 0–44)
AST: 13 U/L — ABNORMAL LOW (ref 15–41)
Albumin: 4.4 g/dL (ref 3.5–5.0)
Alkaline Phosphatase: 64 U/L (ref 38–126)
Anion gap: 5 (ref 5–15)
BUN: 13 mg/dL (ref 6–20)
CO2: 27 mmol/L (ref 22–32)
Calcium: 9.3 mg/dL (ref 8.9–10.3)
Chloride: 106 mmol/L (ref 98–111)
Creatinine, Ser: 0.61 mg/dL (ref 0.44–1.00)
GFR, Estimated: >60 mL/min (ref >60)
Glucose, Bld: 93 mg/dL (ref 70–99)
Potassium: 3.8 mmol/L (ref 3.5–5.1)
Sodium: 138 mmol/L (ref 135–145)
Total Bilirubin: 0.2 mg/dL — ABNORMAL LOW (ref 0.3–1.2)
Total Protein: 8 g/dL (ref 6.5–8.1)

## 2020-09-13 LAB — CBC WITH DIFFERENTIAL/PLATELET
Abs Immature Granulocytes: 0.01 10*3/uL (ref 0.00–0.07)
Basophils Absolute: 0 10*3/uL (ref 0.0–0.1)
Basophils Relative: 0 %
Eosinophils Absolute: 0 10*3/uL (ref 0.0–0.5)
Eosinophils Relative: 1 %
HCT: 35.8 % — ABNORMAL LOW (ref 36.0–46.0)
Hemoglobin: 10.8 g/dL — ABNORMAL LOW (ref 12.0–15.0)
Immature Granulocytes: 0 %
Lymphocytes Relative: 38 %
Lymphs Abs: 2 10*3/uL (ref 0.7–4.0)
MCH: 23.5 pg — ABNORMAL LOW (ref 26.0–34.0)
MCHC: 30.2 g/dL (ref 30.0–36.0)
MCV: 77.8 fL — ABNORMAL LOW (ref 80.0–100.0)
Monocytes Absolute: 0.3 10*3/uL (ref 0.1–1.0)
Monocytes Relative: 6 %
Neutro Abs: 2.8 10*3/uL (ref 1.7–7.7)
Neutrophils Relative %: 55 %
Platelets: 294 10*3/uL (ref 150–400)
RBC: 4.6 MIL/uL (ref 3.87–5.11)
RDW: 17.8 % — ABNORMAL HIGH (ref 11.5–15.5)
WBC: 5.2 10*3/uL (ref 4.0–10.5)
nRBC: 0 % (ref 0.0–0.2)

## 2020-09-13 LAB — LIPASE, BLOOD: Lipase: 32 U/L (ref 11–51)

## 2020-09-13 LAB — I-STAT BETA HCG BLOOD, ED (MC, WL, AP ONLY): I-stat hCG, quantitative: <5 m[IU]/mL (ref <5)

## 2020-09-13 MED ORDER — DICYCLOMINE HCL 20 MG PO TABS: 20.0000 mg | ORAL_TABLET | Freq: Two times a day (BID) | ORAL | 0 refills | Status: DC

## 2020-09-13 NOTE — Discharge Instructions (Addendum)
Take Bentyl as prescribed.  Follow-up with your doctor for recheck.  Return to the emergency room for new or worsening symptoms.

## 2020-09-13 NOTE — ED Provider Notes (Signed)
Emergency Medicine Provider Triage Evaluation Note  Tari Lecount , a 40 y.o. female  was evaluated in triage.  Pt complains of RUQ throbbing pain intermittent x 2 weeks, worse since last night, feels swollen. Pain is not related to eating. Pain radiates to back. Prior cholecystectomy.   Review of Systems  Positive: Abdominal pain, nausea Negative: Fever, vomiting.   Physical Exam  BP 138/67 (BP Location: Left Arm)   Pulse 82   Temp 98.9 F (37.2 C) (Oral)   Resp 16   SpO2 100%  Gen:   Awake, no distress   Resp:  Normal effort  MSK:   Moves extremities without difficulty  Other:    Medical Decision Making  Medically screening exam initiated at 12:37 PM.  Appropriate orders placed.  Enjoli Tidd was informed that the remainder of the evaluation will be completed by another provider, this initial triage assessment does not replace that evaluation, and the importance of remaining in the ED until their evaluation is complete.     Tacy Learn, PA-C 09/13/20 1238    Valarie Merino, MD 09/14/20 [phone]

## 2020-09-13 NOTE — Telephone Encounter (Signed)
[facility] FYI:  ---She has pain on her right side of her abdominal area for the last 2 weeks. Yesterday it became more constant. Still feels sore and bloated. BM;s are more loose for the last several days. eating and drinking well  Advised to call EMS 911 now.

## 2020-09-13 NOTE — ED Triage Notes (Addendum)
Patient c/o right upper abdominal pain that radiates into the back x 2 weeks. Patient describes her pain as throbbing. Patient also c//o nausea  Patient that she has been having right shoulder pain that radiates down the entire arm x 5 months

## 2020-09-13 NOTE — ED Provider Notes (Signed)
[facility] DEPT Provider Note   CSN: [id number] Arrival date & time: 09/13/20  1045     History Chief Complaint  Patient presents with  . Abdominal Pain  . Shoulder Pain    Jenny Wagner is a 40 y.o. female.  Jenny Wagner , a 40 y.o. female  was evaluated in triage.  Pt complains of RUQ throbbing pain intermittent x 2 weeks, worse since last night, feels swollen. Pain is not related to eating. Pain radiates to back. Prior cholecystectomy. Denies vomiting, fever, changes in bowel or bladder habits.  Also with right shoulder discomfort.        Past Medical History:  Diagnosis Date  . Depression     Patient Active Problem List   Diagnosis Date Noted  . 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 Seborrheic dermatitis 01/28/2015    Past Surgical History:  Procedure Laterality Date  . CHOLECYSTECTOMY  2007     OB History   No obstetric history on file.     Family History  Problem Relation Age of Onset  . Hypertension Other   . Diabetes Other     Social History   Tobacco Use  . Smoking status: Never Smoker  . Smokeless tobacco: Never Used  Vaping Use  . Vaping Use: Never used  Substance Use Topics  . Alcohol use: No  . Drug use: No    Home Medications Prior to Admission medications   Medication Sig Start Date End Date Taking? Authorizing Provider  dicyclomine (BENTYL) 20 MG tablet Take 1 tablet (20 mg total) by mouth 2 (two) times daily. 09/13/20  Yes Tacy Learn, PA-C  albuterol (PROVENTIL HFA;VENTOLIN HFA) 108 (90 Base) MCG/ACT inhaler Inhale 2 puffs into the lungs every 6 (six) hours as needed for wheezing or shortness of breath. Patient not taking: Reported on 06/04/2020 06/04/17   Horald Pollen, MD  cyclobenzaprine (FLEXERIL) 10 MG tablet Take 1 tablet (10 mg total) by mouth at bedtime. 06/04/20   Horald Pollen, MD  ferrous sulfate 325 (65 FE) MG tablet Take 1 tablet (325 mg total) by mouth  daily. Patient not taking: Reported on 06/04/2020 04/13/20   Charlann Lange, PA-C    Allergies    Patient has no known allergies.  Review of Systems   Review of Systems  Constitutional: Negative for chills and fever.  HENT: Negative for congestion.   Respiratory: Negative for shortness of breath.   Cardiovascular: Negative for chest pain.  Gastrointestinal: Positive for abdominal pain. Negative for constipation, diarrhea, nausea and vomiting.  Genitourinary: Negative for dysuria and frequency.  Musculoskeletal: Positive for arthralgias. Negative for back pain, joint swelling, neck pain and neck stiffness.  Skin: Negative for rash and wound.  Allergic/Immunologic: Negative for immunocompromised state.  Neurological: Negative for weakness.  All other systems reviewed and are negative.   Physical Exam Updated Vital Signs BP (!) 136/102   Pulse 81   Temp 100 F (37.8 C) (Oral)   Resp 18   Ht 5' (1.524 m)   Wt 64.9 kg   LMP 08/18/2020   SpO2 97%   BMI 27.93 kg/m   Physical Exam Vitals and nursing note reviewed.  Constitutional:      General: She is not in acute distress.    Appearance: She is well-developed. She is not diaphoretic.  HENT:     Head: Normocephalic and atraumatic.  Cardiovascular:     Rate and Rhythm: Normal rate and regular rhythm.  Heart sounds: Normal heart sounds.  Pulmonary:     Effort: Pulmonary effort is normal.  Abdominal:     General: Bowel sounds are normal.     Palpations: Abdomen is soft.     Tenderness: There is no abdominal tenderness. There is no right CVA tenderness or left CVA tenderness.  Skin:    General: Skin is warm and dry.     Findings: No erythema or rash.  Neurological:     Mental Status: She is alert and oriented to person, place, and time.     Motor: No weakness.     Gait: Gait is intact. Gait normal.  Psychiatric:        Behavior: Behavior normal.     ED Results / Procedures / Treatments   Labs (all labs ordered  are listed, but only abnormal results are displayed) Labs Reviewed  CBC WITH DIFFERENTIAL/PLATELET - Abnormal; Notable for the following components:      Result Value   Hemoglobin 10.8 (*)    HCT 35.8 (*)    MCV 77.8 (*)    MCH 23.5 (*)    RDW 17.8 (*)    All other components within normal limits  COMPREHENSIVE METABOLIC PANEL - Abnormal; Notable for the following components:   AST 13 (*)    Total Bilirubin 0.2 (*)    All other components within normal limits  LIPASE, BLOOD  URINALYSIS, ROUTINE W REFLEX MICROSCOPIC  I-STAT BETA HCG BLOOD, ED ([facility], [facility], [facility] ONLY)    EKG None  Radiology No results found.  Procedures Procedures   Medications Ordered in ED Medications - No data to display  ED Course  I have reviewed the triage vital signs and the nursing notes.  Pertinent labs & imaging results that were available during my care of the patient were reviewed by me and considered in my medical decision making (see chart for details).  Clinical Course as of 09/13/20 1810  Fri Sep 13, 2020  1806 40 yo female with complaint of RUQ pain and bloating as above. Exam reassuring, labs reviewed with patient including normal LFTs, normal WBC, hgb improved from prior. She reports heavy menstrual cycles and has been told this is likely the cause of her anemia.  Plan is to try bentyl, recheck with PCP if pain continues and return to the ER for new or worsening symptoms.  [LM]    Clinical Course User Index [LM] Roque Lias   MDM Rules/Calculators/A&P                          Final Clinical Impression(s) / ED Diagnoses Final diagnoses:  Right upper quadrant abdominal pain    Rx / DC Orders ED Discharge Orders         Ordered    dicyclomine (BENTYL) 20 MG tablet  2 times daily        09/13/20 1728           Tacy Learn, PA-C 09/13/20 1810    Daleen Bo, MD 09/13/20 2306

## 2020-10-15 ENCOUNTER — Telehealth (INDEPENDENT_AMBULATORY_CARE_PROVIDER_SITE_OTHER): Payer: 59 | Admitting: Family Medicine

## 2020-10-15 DIAGNOSIS — J029 Acute pharyngitis, unspecified: Secondary | ICD-10-CM

## 2020-10-15 DIAGNOSIS — R509 Fever, unspecified: Secondary | ICD-10-CM

## 2020-10-15 NOTE — Patient Instructions (Signed)
See inperson care this evening at urgent care as we discussed.   I hope you are feeling better soon!   It was nice to meet you today. I help [facility] out with telemedicine visits on Tuesdays and Thursdays and am available for visits on those days. If you have any concerns or questions following this visit please schedule a follow up visit with your Primary Care doctor or seek care at a local urgent care clinic to avoid delays in care.

## 2020-10-15 NOTE — Progress Notes (Signed)
Virtual Visit via Video Note  I connected with Jenny Wagner  on 10/15/20 at  6:20 PM EDT by a video enabled telemedicine application and verified that I am speaking with the correct person using two identifiers.  Location patient: home, [place] Location provider:work or home office Persons participating in the virtual visit: patient, provider  I discussed the limitations of evaluation and management by telemedicine and the availability of in person appointments. The patient expressed understanding and agreed to proceed.   HPI:  Acute telemedicine visit for upper resp symptoms: -Onset: about 4 days -Symptoms include:sore throat, headache, body aches, swollen glands, congestion, drainage, fevers, now feeling worse today with fever of 101 -Denies: CP, SOB, NVD, inability to eat/drink/get out of bed -no known sick contacts -Pertinent past medical history: see below -Pertinent medication allergies: No Known Allergies -COVID-19 vaccine status: none  ROS: See pertinent positives and negatives per HPI.  Past Medical History:  Diagnosis Date   Depression     Past Surgical History:  Procedure Laterality Date   CHOLECYSTECTOMY  2007     Current Outpatient Medications:    albuterol (PROVENTIL HFA;VENTOLIN HFA) 108 (90 Base) MCG/ACT inhaler, Inhale 2 puffs into the lungs every 6 (six) hours as needed for wheezing or shortness of breath. (Patient not taking: Reported on 06/04/2020), Disp: 1 Inhaler, Rfl: 3   cyclobenzaprine (FLEXERIL) 10 MG tablet, Take 1 tablet (10 mg total) by mouth at bedtime., Disp: 30 tablet, Rfl: 1   dicyclomine (BENTYL) 20 MG tablet, Take 1 tablet (20 mg total) by mouth 2 (two) times daily., Disp: 20 tablet, Rfl: 0   ferrous sulfate 325 (65 FE) MG tablet, Take 1 tablet (325 mg total) by mouth daily. (Patient not taking: Reported on 06/04/2020), Disp: 30 tablet, Rfl: 0  EXAM:  VITALS per patient if applicable:  GENERAL: alert, oriented, appears well and in no acute  distress  HEENT: atraumatic, conjunttiva clear, no obvious abnormalities on inspection of external nose and ears  NECK: normal movements of the head and neck  LUNGS: on inspection no signs of respiratory distress, breathing rate appears normal, no obvious gross SOB, gasping or wheezing  CV: no obvious cyanosis  MS: moves all visible extremities without noticeable abnormality  PSYCH/NEURO: pleasant and cooperative, no obvious depression or anxiety, speech and thought processing grossly intact  ASSESSMENT AND PLAN:  Discussed the following assessment and plan:  Sore throat  Fever, unspecified fever cause  -we discussed possible serious and likely etiologies, options for evaluation and workup, limitations of telemedicine visit vs in person visit, treatment, treatment risks and precautions. After a lengthy discussion she has opted to go to [place] for possivle covid/flu and strep test and evaluation to confirm a diagnosis and provide appropriate treatment. Discussed options for care as her PCP office is not available.    I discussed the assessment and treatment plan with the patient. The patient was provided an opportunity to ask questions and all were answered. The patient agreed with the plan and demonstrated an understanding of the instructions.     Lucretia Kern, DO

## 2021-02-07 ENCOUNTER — Telehealth: Payer: Self-pay | Admitting: Emergency Medicine

## 2021-02-07 DIAGNOSIS — N938 Other specified abnormal uterine and vaginal bleeding: Secondary | ICD-10-CM

## 2021-02-07 NOTE — Telephone Encounter (Signed)
Please submit new referral for Gynecologist.. patient says she never received calls to schedule appt  Call patient [phone]

## 2021-02-10 NOTE — Telephone Encounter (Signed)
Yes - absolutely. Thanks.  

## 2021-02-12 NOTE — Telephone Encounter (Signed)
Called and spoke with [facility] to get pt scheduled for an OV. Placed a referral. They stated that they will contact pt to schedule.

## 2021-03-11 ENCOUNTER — Encounter: Payer: Self-pay | Admitting: Internal Medicine

## 2021-03-11 ENCOUNTER — Other Ambulatory Visit: Payer: Self-pay

## 2021-03-11 ENCOUNTER — Ambulatory Visit (INDEPENDENT_AMBULATORY_CARE_PROVIDER_SITE_OTHER): Payer: 59 | Admitting: Internal Medicine

## 2021-03-11 DIAGNOSIS — M25562 Pain in left knee: Secondary | ICD-10-CM | POA: Diagnosis not present

## 2021-03-11 MED ORDER — MELOXICAM 15 MG PO TABS: 15.0000 mg | ORAL_TABLET | Freq: Every day | ORAL | 1 refills | Status: DC | PRN

## 2021-03-11 NOTE — Patient Instructions (Signed)
Please take all new medication as prescribed - the mobic (meloxicam) as an anti-inflammatory as needed  Please also stop on the first floor to see Sports Medicine, as you may have a small cartilage tear that would need to be looked at with ultrasound  Please continue all other medications as before, and refills have been done if requested.  Please have the pharmacy call with any other refills you may need.  Please continue your efforts at being more active, low cholesterol diet, and weight control.  Please keep your appointments with your specialists as you may have planned

## 2021-03-11 NOTE — Progress Notes (Signed)
    Subjective:    CC: L knee pain  I, Jenny Wagner, LAT, ATC, am serving as scribe for Dr. Lynne Wagner.  HPI: Pt is a 40 y/o female presenting w/ L knee pain since 03/08/21 when she bent down to pick something up off of the floor and felt a "pop" in her L medial knee.  She locates her pain to the anterior aspect of the L knee, around the patella.   L knee swelling: slight L knee mechanical symptoms: no Aggravating factors: prolonged sitting, stairs Treatments tried: Mobic rx from Dr. Jenny Reichmann on 03/11/21  Pertinent review of Systems: No fevers or chills  Relevant historical information: Mild asthma   Objective:    Vitals:   03/12/21 0834  BP: 118/82  Pulse: 64  SpO2: 98%   General: Well Developed, well nourished, and in no acute distress.   MSK: Left knee normal-appearing Normal motion. Minimally tender to palpation anterior medial patella. Stable ligamentous exam. Intact strength. Some pain with resisted extension and flexion. Negative Murray's test. Normal gait.  Lab and Radiology Results  Diagnostic Limited MSK Ultrasound of: Left knee Quad tendon intact normal-appearing Small joint effusion present. Patellar tendon normal-appearing Medial joint line normal-appearing with no definitive meniscus tear. Lateral joint line normal.  No definitive meniscus tear. Impression: Mild joint effusion  X-ray images left knee obtained today personally and independently interpreted  Normal-appearing x-ray.  No DJD.  Await formal radiology review    Impression and Recommendations:    Assessment and Plan: 40 y.o. female with left knee pain.  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  Plan to work on quad strengthening for patellofemoral pain syndrome.  Home exercise program taught today in clinic by ATC.  Recheck in 6 weeks.Marland Kitchen  PDMP not reviewed this encounter. Orders Placed This Encounter  Procedures   [place] LIMITED  JOINT SPACE STRUCTURES LOW LEFT(NO LINKED CHARGES)    Standing Status:   Future    Number of Occurrences:   1    Standing Expiration Date:   09/09/2021    Order Specific Question:   Reason for Exam (SYMPTOM  OR DIAGNOSIS REQUIRED)    Answer:   left knee pain    Order Specific Question:   Preferred imaging location?    Answer:   [facility]   DG Knee AP/LAT W/[facility] Left    Standing Status:   Future    Number of Occurrences:   1    Standing Expiration Date:   03/12/2022    Order Specific Question:   Reason for Exam (SYMPTOM  OR DIAGNOSIS REQUIRED)    Answer:   left knee pain    Order Specific Question:   Preferred imaging location?    Answer:   [name]    Order Specific Question:   Is patient pregnant?    Answer:   No   No orders of the defined types were placed in this encounter.   Discussed warning signs or symptoms. Please see discharge instructions. Patient expresses understanding.   The above documentation has been reviewed and is accurate and complete Jenny Wagner, M.D.

## 2021-03-11 NOTE — Progress Notes (Signed)
Patient ID: Jenny Wagner, female   DOB: [birth year], 40 y.o.   MRN: [id number]       Chief Complaint: follow up left knee pain       HPI:  Jenny Wagner is a 40 y.o. female here with c/o acute onset left knee pain and swelling x 3 days; she has hx of bilateral anterior knee pain of uncertain etiology that has been mild intermittent for months but occasionally waking her her up at night; but on sat nov 5 was simply bending to pick up something not heavy and felt a pop and pain to the medial left knee, with pain since then moderate to occasionaly severe , constant, and also waking her up at night.  The pain can radiate both to the upper thigh and distal leg, and has some slight swelling in the area.  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 Walking seems to help it.  Denies unstable, giveaways or falls.  No other injury.  No prior hx of defined knee disorder.  Has not tried any otc meds.         Wt Readings from Last 3 Encounters:  03/11/21 150 lb (68 kg)  09/13/20 143 lb (64.9 kg)  06/04/20 142 lb (64.4 kg)   BP Readings from Last 3 Encounters:  03/11/21 124/70  09/13/20 (!) 136/102  06/04/20 (!) 158/84         Past Medical History:  Diagnosis Date   Depression    Past Surgical History:  Procedure Laterality Date   CHOLECYSTECTOMY  2007    reports that she has never smoked. She has never used smokeless tobacco. She reports that she does not drink alcohol and does not use drugs. family history includes Diabetes in an other family member; Hypertension in an other family member. No Known Allergies Current Outpatient Medications on File Prior to Visit  Medication Sig Dispense Refill   albuterol (PROVENTIL HFA;VENTOLIN HFA) 108 (90 Base) MCG/ACT inhaler Inhale 2 puffs into the lungs every 6 (six) hours as needed for wheezing or shortness of breath. (Patient not taking: No sig reported) 1  Inhaler 3   cyclobenzaprine (FLEXERIL) 10 MG tablet Take 1 tablet (10 mg total) by mouth at bedtime. (Patient not taking: Reported on 03/11/2021) 30 tablet 1   dicyclomine (BENTYL) 20 MG tablet Take 1 tablet (20 mg total) by mouth 2 (two) times daily. (Patient not taking: Reported on 03/11/2021) 20 tablet 0   ferrous sulfate 325 (65 FE) MG tablet Take 1 tablet (325 mg total) by mouth daily. (Patient not taking: No sig reported) 30 tablet 0   No current facility-administered medications on file prior to visit.        ROS:  All others reviewed and negative.  Objective        PE:  BP 124/70 (BP Location: Left Arm, Patient Position: Sitting, Cuff Size: Large)   Pulse 77   Temp 98.8 F (37.1 C) (Oral)   Ht 5' (1.524 m)   Wt 150 lb (68 kg)   SpO2 99%   BMI 29.29 kg/m                 Constitutional: Pt appears in NAD               HENT: Head: NCAT.  Right Ear: External ear normal.                 Left Ear: External ear normal.                Eyes: . Pupils are equal, round, and reactive to light. Conjunctivae and EOM are normal               Nose: without d/c or deformity               Neck: Neck supple. Gross normal ROM               Cardiovascular: Normal rate and regular rhythm.                 Pulmonary/Chest: Effort normal and breath sounds without rales or wheezing.                Abd:  Soft, NT, ND, + BS, no organomegaly               Neurological: Pt is alert. At baseline orientation, motor grossly intact               Skin: Skin is warm. No rashes, no other new lesions, LE edema - none                Left knee with mild tender and swelling over the medial joint line, o/w FROM               Psychiatric: Pt behavior is normal without agitation   Micro: none  Cardiac tracings I have personally interpreted today:  none  Pertinent Radiological findings (summarize): none   Lab Results  Component Value Date   WBC 5.2 09/13/2020   HGB 10.8 (L) 09/13/2020   HCT 35.8  (L) 09/13/2020   PLT 294 09/13/2020   GLUCOSE 93 09/13/2020   ALT 23 09/13/2020   AST 13 (L) 09/13/2020   NA 138 09/13/2020   K 3.8 09/13/2020   CL 106 09/13/2020   CREATININE 0.61 09/13/2020   BUN 13 09/13/2020   CO2 27 09/13/2020   Assessment/Plan:  Jenny Wagner is a 40 y.o. Other or two or more races [6] female with  has a past medical history of Depression.  Left medial knee pain I suspect mild medial meniscal tear; for mobic 15 qd prn, and asked pt to f/u herself at Sports Med on the first floor, as this can be determined best by u/s and then recommendation made for definitive tx  Followup: Return if symptoms worsen or fail to improve.  Cathlean Cower, MD 03/11/2021 12:56 PM [facility]

## 2021-03-11 NOTE — Assessment & Plan Note (Signed)
I suspect mild medial meniscal tear; for mobic 15 qd prn, and asked pt to f/u herself at Sports Med on the first floor, as this can be determined best by u/s and then recommendation made for definitive tx

## 2021-03-12 ENCOUNTER — Ambulatory Visit (INDEPENDENT_AMBULATORY_CARE_PROVIDER_SITE_OTHER): Payer: 59

## 2021-03-12 ENCOUNTER — Ambulatory Visit (INDEPENDENT_AMBULATORY_CARE_PROVIDER_SITE_OTHER): Payer: 59 | Admitting: Family Medicine

## 2021-03-12 ENCOUNTER — Ambulatory Visit: Payer: Self-pay

## 2021-03-12 VITALS — BP 118/82 | HR 64 | Ht 60.0 in | Wt 151.4 lb

## 2021-03-12 DIAGNOSIS — M25562 Pain in left knee: Secondary | ICD-10-CM | POA: Diagnosis not present

## 2021-03-12 NOTE — Patient Instructions (Addendum)
Nice to meet you today.  Please perform the exercise program that we have prepared for you and gone over in detail on a daily basis.  In addition to the handout you were provided you can access your program through: [email or web address]   Your unique program code is:   [email or web address]  Follow-up: 6 weeks

## 2021-03-13 NOTE — Progress Notes (Signed)
Left knee x-ray looks normal to radiology

## 2021-03-19 ENCOUNTER — Ambulatory Visit (INDEPENDENT_AMBULATORY_CARE_PROVIDER_SITE_OTHER): Payer: 59 | Admitting: Obstetrics and Gynecology

## 2021-03-19 ENCOUNTER — Other Ambulatory Visit: Payer: Self-pay

## 2021-03-19 ENCOUNTER — Encounter: Payer: Self-pay | Admitting: Obstetrics and Gynecology

## 2021-03-19 ENCOUNTER — Other Ambulatory Visit (HOSPITAL_COMMUNITY)
Admission: RE | Admit: 2021-03-19 | Discharge: 2021-03-19 | Disposition: A | Discharge status: Home / Self Care | Payer: 59 | Source: Ambulatory Visit | Attending: Obstetrics and Gynecology | Admitting: Obstetrics and Gynecology

## 2021-03-19 DIAGNOSIS — N920 Excessive and frequent menstruation with regular cycle: Secondary | ICD-10-CM | POA: Insufficient documentation

## 2021-03-19 DIAGNOSIS — Z01419 Encounter for gynecological examination (general) (routine) without abnormal findings: Secondary | ICD-10-CM | POA: Diagnosis present

## 2021-03-19 NOTE — Patient Instructions (Signed)

## 2021-03-19 NOTE — Progress Notes (Signed)
Pt presents today as new patient for yearly exam. LMP: 03/05/21. BCM: none. Last pap: unknown, WNL per pt. Pt declines mammogram at this time. Pt c/o AUB, states it has become very heavy on the 2nd day with clots about the size of a quarter. Denies any significant pain. States she has to change her pad on the 2nd-3rd day every 5min wearing an overnight pad. This has been going on for approximately one year.

## 2021-03-20 LAB — CBC
Hematocrit: 34.2 % (ref 34.0–46.6)
Hemoglobin: 10.5 g/dL — ABNORMAL LOW (ref 11.1–15.9)
MCH: 23.8 pg — ABNORMAL LOW (ref 26.6–33.0)
MCHC: 30.7 g/dL — ABNORMAL LOW (ref 31.5–35.7)
MCV: 78 fL — ABNORMAL LOW (ref 79–97)
Platelets: 280 10*3/uL (ref 150–450)
RBC: 4.41 x10E6/uL (ref 3.77–5.28)
RDW: 18.3 % — ABNORMAL HIGH (ref 11.7–15.4)
WBC: 4.1 10*3/uL (ref 3.4–10.8)

## 2021-03-20 LAB — TSH: TSH: 1.16 u[IU]/mL (ref 0.450–4.500)

## 2021-03-24 NOTE — Progress Notes (Signed)
Jenny Wagner is a 40 y.o. G22P2012 female here for a routine annual gynecologic exam.  Current complaints: AUB x 1 yr.      Gynecologic History Patient's last menstrual period was 03/05/2021 (approximate). Contraception: none Last Pap: Uncertain. Results were: normal Last mammogram: Uncertain. Declines today  Obstetric History OB History  Gravida Para Term Preterm AB Living  3 2 2  0 1 2  SAB IAB Ectopic Multiple Live Births  0 1 0 0 2    # Outcome Date GA Lbr Len/2nd Weight Sex Delivery Anes PTL Lv  3 IAB           2 Term           1 Term             Past Medical History:  Diagnosis Date   Depression     Past Surgical History:  Procedure Laterality Date   CHOLECYSTECTOMY  2007    No current outpatient medications on file prior to visit.   No current facility-administered medications on file prior to visit.    No Known Allergies  Social History   Socioeconomic History   Marital status: Married    Spouse name: Not on file   Number of children: 2   Years of education: Not on file   Highest education level: Not on file  Occupational History   Occupation: Stay at home mom  Tobacco Use   Smoking status: Never   Smokeless tobacco: Never  Vaping Use   Vaping Use: Never used  Substance and Sexual Activity   Alcohol use: No   Drug use: No   Sexual activity: Yes    Birth control/protection: None  Other Topics Concern   Not on file  Social History Narrative   Not on file   Social Determinants of Health   Financial Resource Strain: Not on file  Food Insecurity: Not on file  Transportation Needs: Not on file  Physical Activity: Not on file  Stress: Not on file  Social Connections: Not on file  Intimate Partner Violence: Not on file    Family History  Problem Relation Age of Onset   Hypertension Other    Diabetes Other     The following portions of the patient's history were reviewed and updated as appropriate: allergies, current medications, past  family history, past medical history, past social history, past surgical history and problem list.  Review of Systems Pertinent items noted in HPI and remainder of comprehensive ROS otherwise negative.   Objective:  BP (!) 156/94   Pulse 82   Ht 5\' 1"  (1.549 m)   Wt 69.4 kg   LMP 03/05/2021 (Approximate)   BMI 28.91 kg/m  CONSTITUTIONAL: Well-developed, well-nourished female in no acute distress.  HENT:  Normocephalic, atraumatic, External right and left ear normal. Oropharynx is clear and moist EYES: Conjunctivae and EOM are normal. Pupils are equal, round, and reactive to light. No scleral icterus.  NECK: Normal range of motion, supple, no masses.  Normal thyroid.  SKIN: Skin is warm and dry. No rash noted. Not diaphoretic. No erythema. No pallor. [place]: Alert and oriented to person, place, and time. Normal reflexes, muscle tone coordination. No cranial nerve deficit noted. PSYCHIATRIC: Normal mood and affect. Normal behavior. Normal judgment and thought content. CARDIOVASCULAR: Normal heart rate noted, regular rhythm RESPIRATORY: Clear to auscultation bilaterally. Effort and breath sounds normal, no problems with respiration noted. BREASTS: Symmetric in size. No masses, skin changes, nipple drainage, or lymphadenopathy. ABDOMEN: Soft, normal  bowel sounds, no distention noted.  No tenderness, rebound or guarding.  PELVIC: Normal appearing external genitalia; normal appearing vaginal mucosa and cervix.  No abnormal discharge noted.  Pap smear obtained.  Normal uterine size, no other palpable masses, no uterine or adnexal tenderness. MUSCULOSKELETAL: Normal range of motion. No tenderness.  No cyanosis, clubbing, or edema.  2+ distal pulses.   Assessment:  Annual gynecologic examination with pap smear   Plan:  Will follow up results of pap smear and manage accordingly. Mammogram declined by pt. Will check labs and and GYN U/S Routine preventative health maintenance measures  emphasized. Please refer to After Visit Summary for other counseling recommendations.  F/U for GYN results  Chancy Milroy, MD, [place] Attending [facility], [place]

## 2021-03-25 ENCOUNTER — Ambulatory Visit (HOSPITAL_BASED_OUTPATIENT_CLINIC_OR_DEPARTMENT_OTHER)
Admission: RE | Admit: 2021-03-25 | Discharge: 2021-03-25 | Disposition: A | Discharge status: Home / Self Care | Payer: 59 | Source: Ambulatory Visit | Attending: Obstetrics and Gynecology | Admitting: Obstetrics and Gynecology

## 2021-03-25 ENCOUNTER — Other Ambulatory Visit: Payer: Self-pay

## 2021-03-25 ENCOUNTER — Other Ambulatory Visit: Payer: Self-pay | Admitting: *Deleted

## 2021-03-25 DIAGNOSIS — N920 Excessive and frequent menstruation with regular cycle: Secondary | ICD-10-CM

## 2021-03-25 MED ORDER — FERROUS SULFATE 325 (65 FE) MG PO TABS: 325.0000 mg | ORAL_TABLET | Freq: Every day | ORAL | 0 refills | Status: AC

## 2021-03-25 NOTE — Progress Notes (Signed)
Iron supplement sent per protocol for Anemia.  See lab results.

## 2021-03-26 ENCOUNTER — Other Ambulatory Visit: Payer: Self-pay | Admitting: Obstetrics and Gynecology

## 2021-03-26 LAB — CYTOLOGY - PAP
Comment: NEGATIVE
Diagnosis: UNDETERMINED — AB
High risk HPV: NEGATIVE

## 2021-04-01 ENCOUNTER — Telehealth (INDEPENDENT_AMBULATORY_CARE_PROVIDER_SITE_OTHER): Payer: 59 | Admitting: Adult Health

## 2021-04-01 ENCOUNTER — Encounter: Payer: Self-pay | Admitting: Adult Health

## 2021-04-01 DIAGNOSIS — Z8709 Personal history of other diseases of the respiratory system: Secondary | ICD-10-CM

## 2021-04-01 DIAGNOSIS — H6691 Otitis media, unspecified, right ear: Secondary | ICD-10-CM | POA: Insufficient documentation

## 2021-04-01 DIAGNOSIS — R051 Acute cough: Secondary | ICD-10-CM

## 2021-04-01 DIAGNOSIS — J45909 Unspecified asthma, uncomplicated: Secondary | ICD-10-CM | POA: Diagnosis not present

## 2021-04-01 MED ORDER — PREDNISONE 10 MG (21) PO TBPK: ORAL_TABLET | ORAL | 0 refills | Status: DC

## 2021-04-01 MED ORDER — AMOXICILLIN-POT CLAVULANATE 875-125 MG PO TABS: 1.0000 | ORAL_TABLET | Freq: Two times a day (BID) | ORAL | 0 refills | Status: DC

## 2021-04-01 MED ORDER — PSEUDOEPHEDRINE-GUAIFENESIN 30-100 MG/5ML PO SYRP: 5.0000 mL | ORAL_SOLUTION | Freq: Four times a day (QID) | ORAL | 0 refills | Status: DC | PRN

## 2021-04-01 MED ORDER — ALBUTEROL SULFATE HFA 108 (90 BASE) MCG/ACT IN AERS: 2.0000 | INHALATION_SPRAY | Freq: Four times a day (QID) | RESPIRATORY_TRACT | 0 refills | Status: DC | PRN

## 2021-04-01 NOTE — Patient Instructions (Signed)
Cough, Jenny Wagner cough helps to clear your throat and lungs. Wagner cough may be Wagner sign of an illness or another medical condition. An acute cough may only last 2-3 weeks, while Wagner chronic cough may last 8 or more weeks. Many things can cause Wagner cough. They include: Germs (viruses or bacteria) that attack the airway. Breathing in things that bother (irritate) your lungs. Allergies. Asthma. Mucus that runs down the back of your throat (postnasal drip). Smoking. Acid backing up from the stomach into the tube that moves food from the mouth to the stomach (gastroesophageal reflux). Some medicines. Lung problems. Other medical conditions, such as heart failure or Wagner blood clot in the lung (pulmonary embolism). Follow these instructions at home: Medicines Take over-the-counter and prescription medicines only as told by your doctor. Talk with your doctor before you take medicines that stop Wagner cough (cough suppressants). Lifestyle  Do not smoke, and try not to be around smoke. Do not use any products that contain nicotine or tobacco, such as cigarettes, e-cigarettes, and chewing tobacco. If you need help quitting, ask your doctor. Drink enough fluid to keep your pee (urine) pale yellow. Avoid caffeine. Do not drink alcohol if your doctor tells you not to drink. General instructions  Watch for any changes in your cough. Tell your doctor about them. Always cover your mouth when you cough. Stay away from things that make you cough, such as perfume, candles, campfire smoke, or cleaning products. If the air is dry, use Wagner cool mist vaporizer or humidifier in your home. If your cough is worse at night, try using extra pillows to raise your head up higher while you sleep. Rest as needed. Keep all follow-up visits as told by your doctor. This is important. Contact Wagner doctor if: You have new symptoms. You cough up pus. Your cough does not get better after 2-3 weeks, or your cough gets worse. Cough medicine  does not help your cough and you are not sleeping well. You have pain that gets worse or pain that is not helped with medicine. You have Wagner fever. You are losing weight and you do not know why. You have night sweats. Get help right away if: You cough up blood. You have trouble breathing. Your heartbeat is very fast. These symptoms may be an emergency. Do not wait to see if the symptoms will go away. Get medical help right away. Call your local emergency services (911 in the [place] Do not drive yourself to the hospital. Summary Wagner cough helps to clear your throat and lungs. Many things can cause Wagner cough. Take over-the-counter and prescription medicines only as told by your doctor. Always cover your mouth when you cough. Contact Wagner doctor if you have new symptoms or you have Wagner cough that does not get better or gets worse. This information is not intended to replace advice given to you by your health care provider. Make sure you discuss any questions you have with your health care provider. Document Revised: 06/09/2019 Document Reviewed: 05/09/2018 Elsevier Patient Education  [place]. Otitis Media, Jenny Otitis media occurs when there is inflammation and fluid in the middle ear with signs and symptoms of an acute infection. The middle ear is Wagner part of the ear that contains bones for hearing as well as air that helps send sounds to the brain. When infected fluid builds up in this space, it causes pressure and can lead to an ear infection. The eustachian tube connects the middle ear  to the back of the nose (nasopharynx) and normally allows air into the middle ear. If the eustachian tube becomes blocked, fluid can build up and become infected. What are the causes? This condition is caused by Wagner blockage in the eustachian tube. This can be caused by mucus or by swelling of the tube. Problems that can cause Wagner blockage include: Wagner cold or other upper respiratory infection. Allergies. An  irritant, such as tobacco smoke. Enlarged adenoids. The adenoids are areas of soft tissue located high in the back of the throat, behind the nose and the roof of the mouth. They are part of the body's defense system (immune system). Wagner mass in the nasopharynx. Damage to the ear caused by pressure changes (barotrauma). What increases the risk? You are more likely to develop this condition if you: Smoke or are exposed to tobacco smoke. Have an opening in the roof of your mouth (cleft palate). Have gastroesophageal reflux. Have an immune system disorder. What are the signs or symptoms? Symptoms of this condition include: Ear pain. Fever. Decreased hearing. Tiredness (lethargy). Fluid leaking from the ear, if the eardrum is ruptured or has burst. Ringing in the ear. How is this diagnosed? This condition is diagnosed with Wagner physical exam. During the exam, your health care provider will use an instrument called an otoscope to look in your ear and check for redness, swelling, and fluid. He or she will also ask about your symptoms. Your health care provider may also order tests, such as: Wagner pneumatic otoscopy. This is Wagner test to check the movement of the eardrum. It is done by squeezing Wagner small amount of air into the ear. Wagner tympanogram. This is Wagner test that shows how well the eardrum moves in response to air pressure in the ear canal. It provides Wagner graph for your health care provider to review. How is this treated? This condition can go away on its own within 3-5 days. But if the condition is caused by Wagner bacterial infection and does not go away on its own, or if it keeps coming back, your health care provider may: Prescribe antibiotic medicine to treat the infection. Prescribe or recommend medicines to control pain. Follow these instructions at home: Take over-the-counter and prescription medicines only as told by your health care provider. If you were prescribed an antibiotic medicine, take it as  told by your health care provider. Do not stop taking the antibiotic even if you start to feel better. Keep all follow-up visits. This is important. Contact Wagner health care provider if: You have bleeding from your nose. There is Wagner lump on your neck. You are not feeling better in 5 days. You feel worse instead of better. Get help right away if: You have severe pain that is not controlled with medicine. You have swelling, redness, or pain around your ear. You have stiffness in your neck. Wagner part of your face is not moving (paralyzed). The bone behind your ear (mastoid bone) is tender when you touch it. You develop Wagner severe headache. Summary Otitis media is redness, soreness, and swelling of the middle ear, usually resulting in pain and decreased hearing. This condition can go away on its own within 3-5 days. If the problem does not go away in 3-5 days, your health care provider may give you medicines to treat the infection. If you were prescribed an antibiotic medicine, take it as told by your health care provider. Follow all instructions that were given to you by your  health care provider. This information is not intended to replace advice given to you by your health care provider. Make sure you discuss any questions you have with your health care provider. Document Revised: 07/29/2020 Document Reviewed: 07/29/2020 Elsevier Patient Education  [place]. Albuterol Metered Dose Inhaler (MDI) What is this medication? ALBUTEROL (al [place]) treats lung diseases, such as asthma, where the airways in the lungs narrow, causing breathing problems or wheezing (bronchospasm). It is also used to treat asthma or prevent breathing problems during exercise. This medication works by opening the airways of the lungs, making it easier to breathe. It is often called Wagner rescue- or quick-relief inhaler. This medicine may be used for other purposes; ask your health care provider or pharmacist if you  have questions. COMMON BRAND NAME(S): Proair HFA, Proventil, Proventil HFA, Respirol, Ventolin, Ventolin HFA What should I tell my care team before I take this medication? They need to know if you have any of these conditions: Diabetes (high blood sugar) Heart disease High blood pressure Irregular heartbeat or rhythm Pheochromocytoma Seizures Thyroid disease An unusual or allergic reaction to albuterol, other medications, foods, dyes, or preservatives Pregnant or trying to get pregnant Breast-feeding How should I use this medication? This medication is inhaled through the mouth. Take it as directed on the prescription label. Do not use it more often than directed. This medication comes with INSTRUCTIONS FOR USE. Ask your pharmacist for directions on how to use this medication. Read the information carefully. Talk to your pharmacist or care team if you have questions. Talk to your care team about the use of this medication in children. While it may be given to children for selected conditions, precautions do apply. Overdosage: If you think you have taken too much of this medicine contact Wagner poison control center or emergency room at once. NOTE: This medicine is only for you. Do not share this medicine with others. What if I miss Wagner dose? If you take this medication on Wagner regular basis, take it as soon as you can. If it is almost time for your next dose, take only that dose. Do not take double or extra doses. What may interact with this medication? Caffeine Chloroquine Cisapride Diuretics Medications for colds Medications for depression or for emotional or psychotic conditions Medications for weight loss including some herbal products Methadone Pentamidine Some antibiotics like clarithromycin, erythromycin, levofloxacin, and linezolid Some heart medications Steroid hormones like dexamethasone, cortisone, hydrocortisone Theophylline Thyroid hormones This list may not describe all  possible interactions. Give your health care provider Wagner list of all the medicines, herbs, non-prescription drugs, or dietary supplements you use. Also tell them if you smoke, drink alcohol, or use illegal drugs. Some items may interact with your medicine. What should I watch for while using this medication? Visit your care team for regular checks on your progress. Tell your care team if your symptoms do not start to get better or if they get worse. If your symptoms get worse or if you are using this medication more than normal, call your care team right away. Do not treat yourself for coughs, colds or allergies without asking your care team for advice. Some nonprescription medications can affect this one. You and your care team should develop an Asthma Action Plan that is just for you. Be sure to know what to do if you are in the yellow (asthma is getting worse) or red (medical alert) zones. Your mouth may get dry. Chewing sugarless gum or  sucking hard candy and drinking plenty of water may help. Contact your health care provider if the problem does not go away or is severe. What side effects may I notice from receiving this medication? Side effects that you should report to your care team as soon as possible: Allergic reactions--skin rash, itching, hives, swelling of the face, lips, tongue, or throat Heart rhythm changes--fast or irregular heartbeat, dizziness, feeling faint or lightheaded, chest pain, trouble breathing Increase in blood pressure Muscle pain or cramps Wheezing or trouble breathing that is worse after use Side effects that usually do not require medical attention (report to your care team if they continue or are bothersome): Change in taste Dry mouth Headache Sore throat Tremors or shaking Trouble sleeping This list may not describe all possible side effects. Call your doctor for medical advice about side effects. You may report side effects to FDA at 1-800-FDA-1088. Where  should I keep my medication? Keep out of the reach of children and pets. Store at room temperature between 20 and 25 degrees C (68 and 77 degrees F). Keep inhaler away from extreme heat and cold. Get rid of it when the dose counter reads 0 or after the expiration date, whichever is first. To get rid of medications that are no longer needed or have expired: Take the medication to Wagner medication take-back program. Check with your pharmacy or law enforcement to find Wagner location. If you cannot return the medication, ask your care team how to get rid of this medication safely. NOTE: This sheet is Wagner summary. It may not cover all possible information. If you have questions about this medicine, talk to your doctor, pharmacist, or health care provider.  2022 Elsevier/Gold Standard (2020-04-05 00:00:00) Prednisolone Tablets What is this medication? PREDNISOLONE (pred NISS oh lone) treats many conditions such as asthma, allergic reactions, arthritis, inflammatory bowel diseases, adrenal, and blood or bone marrow disorders. It works by decreasing inflammation, slowing down an overactive immune system, or replacing cortisol normally made in the body. Cortisol is Wagner hormone that plays an important role in how the body responds to stress, illness, and injury. It belongs to Wagner group of medications called steroids. This medicine may be used for other purposes; ask your health care provider or pharmacist if you have questions. COMMON BRAND NAME(S): Millipred, Millipred DP, Millipred DP 12-Day, Millipred DP 6 Day, Prednoral What should I tell my care team before I take this medication? They need to know if you have any of these conditions: Cushing's syndrome Diabetes Glaucoma Heart problems or disease High blood pressure Infection such as herpes, measles, tuberculosis, or chickenpox Kidney disease Liver disease Mental problems Myasthenia gravis Osteoporosis Seizures Stomach ulcer or intestine disease including  colitis and diverticulitis Thyroid problem An unusual or allergic reaction to lactose, prednisolone, other medications, foods, dyes, or preservatives Pregnant or trying to get pregnant Breast-feeding How should I use this medication? Take this medication by mouth with Wagner glass of water. Follow the directions on the prescription label. Take it with food or milk to avoid stomach upset. If you are taking this medication once Wagner day, take it in the morning. Do not take more medication than you are told to take. Do not suddenly stop taking your medication because you may develop Wagner severe reaction. Your care team will tell you how much medication to take. If your care team wants you to stop the medication, the dose may be slowly lowered over time to avoid any side effects. Talk to your  care team about the use of this medication in children. Special care may be needed. Overdosage: If you think you have taken too much of this medicine contact Wagner poison control center or emergency room at once. NOTE: This medicine is only for you. Do not share this medicine with others. What if I miss Wagner dose? If you miss Wagner dose, take it as soon as you can. If it is almost time for your next dose, take only that dose. Do not take double or extra doses. What may interact with this medication? Do not take this medication with any of the following: Metyrapone Mifepristone This medication may also interact with the following: Aminoglutethimide Amphotericin B Aspirin and aspirin-like medications Barbiturates Certain medications for diabetes, like glipizide or glyburide Cholestyramine Cholinesterase inhibitors Cyclosporine Digoxin Diuretics Ephedrine Female hormones, like estrogens and birth control pills Isoniazid Ketoconazole NSAIDS, medications for pain and inflammation, like ibuprofen or naproxen Phenytoin Rifampin Toxoids Vaccines Warfarin This list may not describe all possible interactions. Give your health  care provider Wagner list of all the medicines, herbs, non-prescription drugs, or dietary supplements you use. Also tell them if you smoke, drink alcohol, or use illegal drugs. Some items may interact with your medicine. What should I watch for while using this medication? Visit your care team for regular checks on your progress. If you are taking this medication over Wagner prolonged period, carry an identification card with your name and address, the type and dose of your medication, and your care team's name and address. This medication may increase your risk of getting an infection. Tell your care team if you are around anyone with measles or chickenpox, or if you develop sores or blisters that do not heal properly. If you are going to have surgery, tell your care team that you have taken this medication within the last twelve months. Ask your care team about your diet. You may need to lower the amount of salt you eat. This medication may increase blood sugar. Ask your care team if changes in diet or medications are needed if you have diabetes. What side effects may I notice from receiving this medication? Side effects that you should report to your care team as soon as possible: Allergic reactions--skin rash, itching, hives, swelling of the face, lips, tongue, or throat Cushing syndrome--increased fat around the midsection, upper back, neck, or face, pink or purple stretch marks on the skin, thinning, fragile skin that easily bruises, unexpected hair growth High blood sugar (hyperglycemia)--increased thirst or amount of urine, unusual weakness or fatigue, blurry vision Increase in blood pressure Infection--fever, chills, cough, sore throat, wounds that don't heal, pain or trouble when passing urine, general feeling of discomfort or being unwell Low adrenal gland function--nausea, vomiting, loss of appetite, unusual weakness or fatigue, dizziness Mood and behavior changes--anxiety, nervousness, confusion,  hallucinations, irritability, hostility, thoughts of suicide or self-harm, worsening mood, feelings of depression Stomach bleeding--bloody or black, tar-like stools, vomiting blood or brown material that looks like coffee grounds Swelling of the ankles, hands, or feet Side effects that usually do not require medical attention (report to your care team if they continue or are bothersome): Acne General discomfort and fatigue Headache Increase in appetite Nausea Trouble sleeping Weight gain This list may not describe all possible side effects. Call your doctor for medical advice about side effects. You may report side effects to FDA at 1-800-FDA-1088. Where should I keep my medication? Keep out of the reach of children. Store at room temperature between  15 and 30 degrees C (59 and 86 degrees F). Keep container tightly closed. Throw away any unused medication after the expiration date. NOTE: This sheet is Wagner summary. It may not cover all possible information. If you have questions about this medicine, talk to your doctor, pharmacist, or health care provider.  2022 Elsevier/Gold Standard (2020-07-19 00:00:00) Amoxicillin; Clavulanic Acid Tablets What is this medication? AMOXICILLIN; CLAVULANIC ACID (Wagner mox i SIL in; KLAV yoo lan ic AS id) treats infections caused by bacteria. It belongs to Wagner group of medications called penicillin antibiotics. It will not treat colds, the flu, or infections caused by viruses. This medicine may be used for other purposes; ask your health care provider or pharmacist if you have questions. COMMON BRAND NAME(S): Augmentin What should I tell my care team before I take this medication? They need to know if you have any of these conditions: Kidney disease Liver disease Mononucleosis Stomach or intestine problems such as colitis An unusual or allergic reaction to amoxicillin, other penicillin or cephalosporin antibiotics, clavulanic acid, other medications, foods, dyes,  or preservatives Pregnant or trying to get pregnant Breast-feeding How should I use this medication? Take this medication by mouth. Take it as directed on the prescription label at the same time every day. Take it with food at the start of Wagner meal or snack. Take all of this medication unless your care team tells you to stop it early. Keep taking it even if you think you are better. Talk to your care team about the use of this medication in children. While it may be prescribed for selected conditions, precautions do apply. Overdosage: If you think you have taken too much of this medicine contact Wagner poison control center or emergency room at once. NOTE: This medicine is only for you. Do not share this medicine with others. What if I miss Wagner dose? If you miss Wagner dose, take it as soon as you can. If it is almost time for your next dose, take only that dose. Do not take double or extra doses. What may interact with this medication? Allopurinol Anticoagulants Birth control pills Methotrexate Probenecid This list may not describe all possible interactions. Give your health care provider Wagner list of all the medicines, herbs, non-prescription drugs, or dietary supplements you use. Also tell them if you smoke, drink alcohol, or use illegal drugs. Some items may interact with your medicine. What should I watch for while using this medication? Tell your care team if your symptoms do not start to get better or if they get worse. This medication may cause serious skin reactions. They can happen weeks to months after starting the medication. Contact your care team right away if you notice fevers or flu-like symptoms with Wagner rash. The rash may be red or purple and then turn into blisters or peeling of the skin. Or, you might notice Wagner red rash with swelling of the face, lips or lymph nodes in your neck or under your arms. Do not treat diarrhea with over the counter products. Contact your care team if you have diarrhea  that lasts more than 2 days or if it is severe and watery. If you have diabetes, you may get Wagner false-positive result for sugar in your urine. Check with your care team. Birth control may not work properly while you are taking this medication. Talk to your care team about using an extra method of birth control. What side effects may I notice from receiving this medication? Side effects  that you should report to your care team as soon as possible: Allergic reactions--skin rash, itching, hives, swelling of the face, lips, tongue, or throat Liver injury--right upper belly pain, loss of appetite, nausea, light-colored stool, dark yellow or brown urine, yellowing skin or eyes, unusual weakness or fatigue Redness, blistering, peeling, or loosening of the skin, including inside the mouth Severe diarrhea, fever Unusual vaginal discharge, itching, or odor Side effects that usually do not require medical attention (report to your care team if they continue or are bothersome): Diarrhea Nausea Vomiting This list may not describe all possible side effects. Call your doctor for medical advice about side effects. You may report side effects to FDA at 1-800-FDA-1088. Where should I keep my medication? Keep out of the reach of children and pets. Store at room temperature between 20 and 25 degrees C (68 and 77 degrees F). Throw away any unused medication after the expiration date. NOTE: This sheet is Wagner summary. It may not cover all possible information. If you have questions about this medicine, talk to your doctor, pharmacist, or health care provider.  2022 Elsevier/Gold Standard (2020-04-14 00:00:00)

## 2021-04-01 NOTE — Progress Notes (Signed)
Virtual Visit via Video Note  I connected with Jenny Wagner on 04/01/21 at  4:30 PM EST by a video enabled telemedicine application and verified that I am speaking with the correct person using two identifiers.  Location: Patient: at home  Provider: Provider: Provider's office at  [facility], [place].      I discussed the limitations of evaluation and management by telemedicine and the availability of in person appointments. The patient expressed understanding and agreed to proceed.  History of Present Illness: Onset 03/26/21 started with cough that was mild. She has cough productive thick yellow phlegm.  Right ear pain since yesterday. Denies any drainage in ear.   Tested negative for Covid and for flu at [facility] this morning and was negative for flu and covid.  Patient  denies any fever, body aches,chills, rash, chest pain, shortness of breath, nausea, vomiting, or diarrhea. Denies any edema.   History of asthma.    Patient's last menstrual period was 03/05/2021 (approximate).  Observations/Objective:   Patient is alert and oriented and responsive to questions Engages in conversation with provider. Speaks in full sentences without any pauses without any shortness of breath or distress.   Assessment and Plan:  Acute cough - Plan: DG Chest 2 View, predniSONE (STERAPRED UNI-PAK 21 TAB) 10 MG (21) TBPK tablet, pseudoephedrine-guaifenesin (TUSSIN PE) 30-100 MG/5ML SYRP, DISCONTINUED: predniSONE (STERAPRED UNI-PAK 21 TAB) 10 MG (21) TBPK tablet, DISCONTINUED: pseudoephedrine-guaifenesin (TUSSIN PE) 30-100 MG/5ML SYRP  Mild reactive airways disease, unspecified whether persistent - Plan: albuterol (VENTOLIN HFA) 108 (90 Base) MCG/ACT inhaler, DISCONTINUED: albuterol (VENTOLIN HFA) 108 (90 Base) MCG/ACT inhaler  Right otitis media, unspecified otitis media type - Plan: amoxicillin-clavulanate (AUGMENTIN) 875-125 MG tablet,  DISCONTINUED: amoxicillin-clavulanate (AUGMENTIN) 875-125 MG tablet  History of asthma  Prescriptions originally sent to [facility] in [place] and she wants at [facility] and spoke with [place] A[place]as tech who canceled at [employer].   Given  history of pneumonia she will go to [facility] for chest x ary   Advised in person evaluation at anytime is advised if any symptoms do not improve, worsen or change at any given time.  Red Flags discussed. The patient was given clear instructions to go to ER or return to medical center if any red flags develop, symptoms do not improve, worsen or new problems develop. They verbalized understanding.    Follow Up Instructions:   Advised patient call the office or your primary care doctor for an appointment if no improvement within 72 hours or if any symptoms change or worsen at any time  Advised ER or urgent Care if after hours or on weekend. Call 911 for emergency symptoms at any time.Patinet verbalized understanding of all instructions given/reviewed and treatment plan and has no further questions or concerns at this time.    I discussed the assessment and treatment plan with the patient. The patient was provided an opportunity to ask questions and all were answered. The patient agreed with the plan and demonstrated an understanding of the instructions.   The patient was advised to call back or seek an in-person evaluation if the symptoms worsen or if the condition fails to improve as anticipated.  I provided 20 minutes of non-face-to-face time during this encounter.

## 2021-04-02 ENCOUNTER — Telehealth: Payer: Self-pay

## 2021-04-02 ENCOUNTER — Ambulatory Visit
Admission: RE | Admit: 2021-04-02 | Discharge: 2021-04-02 | Disposition: A | Discharge status: Home / Self Care | Payer: 59 | Source: Ambulatory Visit | Attending: Adult Health | Admitting: Adult Health

## 2021-04-02 NOTE — Progress Notes (Signed)
Chest x ray is within normal limits.

## 2021-04-02 NOTE — Telephone Encounter (Addendum)
Left pt a detailed voicemail (per DPR) letting her know that her pap smear showed some atypical cells and that she would need a repeat pap smear in 3 years per Dr.Ervin. Reminded pt to continue to make yearly GYN appts. Pt provided office phone number to call if she has any questions.   ----- Message from Chancy Milroy, MD sent at 03/31/2021  4:17 PM EST ----- Please let Ms Hemmer know that her pap smear showed some atypical cells. Per guidelines repeat pap smear in 3 yrs.  Thanks Legrand Como

## 2021-04-10 ENCOUNTER — Other Ambulatory Visit: Payer: Self-pay

## 2021-04-10 ENCOUNTER — Encounter: Payer: Self-pay | Admitting: Obstetrics and Gynecology

## 2021-04-10 ENCOUNTER — Ambulatory Visit (INDEPENDENT_AMBULATORY_CARE_PROVIDER_SITE_OTHER): Payer: 59 | Admitting: Obstetrics and Gynecology

## 2021-04-10 VITALS — BP 129/81 | HR 66 | Wt 156.2 lb

## 2021-04-10 DIAGNOSIS — D219 Benign neoplasm of connective and other soft tissue, unspecified: Secondary | ICD-10-CM | POA: Diagnosis not present

## 2021-04-10 DIAGNOSIS — N920 Excessive and frequent menstruation with regular cycle: Secondary | ICD-10-CM

## 2021-04-10 MED ORDER — TRANEXAMIC ACID 650 MG PO TABS: 1300.0000 mg | ORAL_TABLET | Freq: Three times a day (TID) | ORAL | 2 refills | Status: DC

## 2021-04-10 NOTE — Progress Notes (Signed)
Jenny Wagner presents for follow up of U/S for AUB. U/S revealed large @ 9 cm submucosal fibroid. Discussed with pt.  PE AF VSS Lungs clear Heart RRR Abd soft + BS   A/P AUB        Uterine fibroids  Tx options reviewed with pt. Following discuss ion pt desires to try Txa . U/R/B reviewed. Will follow up in 3-4 months

## 2021-04-10 NOTE — Progress Notes (Signed)
Pt presents to follow up after pelvic u/s for AUB.

## 2021-04-18 NOTE — Progress Notes (Deleted)
° °  I, Wendy Poet, LAT, ATC, am serving as scribe for Dr. Lynne Leader.  Jenny Wagner is a 40 y.o. 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  She was last seen by Dr. Georgina Snell on 03/12/21 and was shown a HEP focusing on quad and hip/glute strengthening.  Today, pt reports   Diagnostic testing: L knee XR- 03/12/21  Pertinent review of systems: ***  Relevant historical information: ***   Exam:  There were no vitals taken for this visit. General: Well Developed, well nourished, and in no acute distress.   MSK: ***    Lab and Radiology Results No results found for this or any previous visit (from the past 72 hour(s)). No results found.     Assessment and Plan: 40 y.o. female with ***   PDMP not reviewed this encounter. No orders of the defined types were placed in this encounter.  No orders of the defined types were placed in this encounter.    Discussed warning signs or symptoms. Please see discharge instructions. Patient expresses understanding.   ***

## 2021-04-23 ENCOUNTER — Ambulatory Visit: Payer: 59 | Admitting: Family Medicine

## 2022-01-21 ENCOUNTER — Encounter: Payer: Self-pay | Admitting: Emergency Medicine

## 2022-01-21 ENCOUNTER — Ambulatory Visit: Payer: 59 | Admitting: Emergency Medicine

## 2022-01-21 ENCOUNTER — Ambulatory Visit (INDEPENDENT_AMBULATORY_CARE_PROVIDER_SITE_OTHER): Payer: 59 | Admitting: Emergency Medicine

## 2022-01-21 VITALS — BP 156/100 | HR 84 | Temp 98.2°F | Ht 61.0 in | Wt 154.4 lb

## 2022-01-21 DIAGNOSIS — I10 Essential (primary) hypertension: Secondary | ICD-10-CM

## 2022-01-21 DIAGNOSIS — G44229 Chronic tension-type headache, not intractable: Secondary | ICD-10-CM | POA: Insufficient documentation

## 2022-01-21 LAB — COMPREHENSIVE METABOLIC PANEL
ALT: 18 U/L (ref 0–35)
AST: 15 U/L (ref 0–37)
Albumin: 4.1 g/dL (ref 3.5–5.2)
Alkaline Phosphatase: 66 U/L (ref 39–117)
BUN: 12 mg/dL (ref 6–23)
CO2: 24 mEq/L (ref 19–32)
Calcium: 9 mg/dL (ref 8.4–10.5)
Chloride: 105 mEq/L (ref 96–112)
Creatinine, Ser: 0.75 mg/dL (ref 0.40–1.20)
GFR: 97.77 mL/min (ref >60.00)
Glucose, Bld: 94 mg/dL (ref 70–99)
Potassium: 4 mEq/L (ref 3.5–5.1)
Sodium: 137 mEq/L (ref 135–145)
Total Bilirubin: 0.3 mg/dL (ref 0.2–1.2)
Total Protein: 7.6 g/dL (ref 6.0–8.3)

## 2022-01-21 LAB — CBC WITH DIFFERENTIAL/PLATELET
Basophils Absolute: 0 10*3/uL (ref 0.0–0.1)
Basophils Relative: 0.6 % (ref 0.0–3.0)
Eosinophils Absolute: 0 10*3/uL (ref 0.0–0.7)
Eosinophils Relative: 1.1 % (ref 0.0–5.0)
HCT: 34.4 % — ABNORMAL LOW (ref 36.0–46.0)
Hemoglobin: 10.9 g/dL — ABNORMAL LOW (ref 12.0–15.0)
Lymphocytes Relative: 40.1 % (ref 12.0–46.0)
Lymphs Abs: 1.7 10*3/uL (ref 0.7–4.0)
MCHC: 31.7 g/dL (ref 30.0–36.0)
MCV: 73.2 fl — ABNORMAL LOW (ref 78.0–100.0)
Monocytes Absolute: 0.3 10*3/uL (ref 0.1–1.0)
Monocytes Relative: 6.1 % (ref 3.0–12.0)
Neutro Abs: 2.2 10*3/uL (ref 1.4–7.7)
Neutrophils Relative %: 52.1 % (ref 43.0–77.0)
Platelets: 271 10*3/uL (ref 150.0–400.0)
RBC: 4.69 Mil/uL (ref 3.87–5.11)
RDW: 17.8 % — ABNORMAL HIGH (ref 11.5–15.5)
WBC: 4.3 10*3/uL (ref 4.0–10.5)

## 2022-01-21 LAB — LIPID PANEL
Cholesterol: 164 mg/dL (ref 0–200)
HDL: 47.1 mg/dL (ref >39.00)
LDL Cholesterol: 84 mg/dL (ref 0–99)
NonHDL: 117.19
Total CHOL/HDL Ratio: 3
Triglycerides: 165 mg/dL — ABNORMAL HIGH (ref 0.0–149.0)
VLDL: 33 mg/dL (ref 0.0–40.0)

## 2022-01-21 LAB — TSH: TSH: 1.55 u[IU]/mL (ref 0.35–5.50)

## 2022-01-21 LAB — HEMOGLOBIN A1C: Hgb A1c MFr Bld: 5.8 % (ref 4.6–6.5)

## 2022-01-21 MED ORDER — VALSARTAN-HYDROCHLOROTHIAZIDE 80-12.5 MG PO TABS: 1.0000 | ORAL_TABLET | Freq: Every day | ORAL | 3 refills | Status: DC

## 2022-01-21 NOTE — Assessment & Plan Note (Signed)
No red flag signs or symptoms.  No neurological deficits or abnormal findings on physical examination.  Most likely related to uncontrolled hypertension. May take Advil or Tylenol as needed for headaches We will start antihypertensive medication today and follow-up in 4 weeks.

## 2022-01-21 NOTE — Assessment & Plan Note (Signed)
New onset hypertension most likely cause of recurring chronic headaches. Cardiovascular risks associated with hypertension discussed. Dietary approaches to stop hypertension discussed. Needs blood work today. Will start valsartan-12.5 mg-HCTZ 80-12 0.5 daily Advised to monitor blood pressure readings at home daily for the next several weeks and keep a log.  Advised to contact the office if numbers persistently elevated. Follow-up in 4 weeks.

## 2022-01-21 NOTE — Progress Notes (Signed)
[place] 41 y.o.   Chief Complaint  Patient presents with   Headache    Off and on headaches   concern about blood pressure    HISTORY OF PRESENT ILLNESS: This is a 41 y.o. female complaining of occasional headaches for several weeks. Blood pressure has been elevated persistently for the past several weeks. Was elevated at the dentist.  Yesterday at foot doctor's place was also elevated Stressful lifestyle.  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 No other complaints or medical concerns today. BP Readings from Last 3 Encounters:  01/21/22 (!) 156/100  04/10/21 129/81  03/19/21 (!) 156/94     Headache  Pertinent negatives include no abdominal pain, coughing, fever, nausea, sore throat or vomiting.     Prior to Admission medications   Medication Sig Start Date End Date Taking? Authorizing Provider  albuterol (VENTOLIN HFA) 108 (90 Base) MCG/ACT inhaler Inhale 2 puffs into the lungs every 6 (six) hours as needed for wheezing or shortness of breath. Patient not taking: Reported on 01/21/2022 04/01/21   Flinchum, Kelby Aline, FNP  ferrous sulfate 325 (65 FE) MG tablet Take 1 tablet (325 mg total) by mouth daily with breakfast. Patient not taking: Reported on 01/21/2022 03/25/21   Chancy Milroy, MD  Multiple Vitamin (MULTI-VITAMIN) tablet Take 1 tablet by mouth daily. Patient not taking: Reported on 01/21/2022    [provider]  tranexamic acid (LYSTEDA) 650 MG TABS tablet Take 2 tablets (1,300 mg total) by mouth 3 (three) times daily. Take during menses for a maximum of five days Patient not taking: Reported on 01/21/2022 04/10/21   Chancy Milroy, MD    No Known Allergies  Patient Active Problem List   Diagnosis Date Noted   Fibroid 04/10/2021   Menorrhagia with regular cycle 03/19/2021   Mild reactive airways disease 06/04/2017   Seborrheic dermatitis 01/28/2015    Past Medical History:  Diagnosis Date   Depression     Past  Surgical History:  Procedure Laterality Date   CHOLECYSTECTOMY  2007    Social History   Socioeconomic History   Marital status: Married    Spouse name: Not on file   Number of children: 2   Years of education: Not on file   Highest education level: Not on file  Occupational History   Occupation: Stay at home mom  Tobacco Use   Smoking status: Never   Smokeless tobacco: Never  Vaping Use   Vaping Use: Never used  Substance and Sexual Activity   Alcohol use: No   Drug use: No   Sexual activity: Yes    Birth control/protection: None  Other Topics Concern   Not on file  Social History Narrative   Not on file   Social Determinants of Health   Financial Resource Strain: Not on file  Food Insecurity: Not on file  Transportation Needs: Not on file  Physical Activity: Not on file  Stress: Not on file  Social Connections: Not on file  Intimate Partner Violence: Not on file    Family History  Problem Relation Age of Onset   Hypertension Other    Diabetes Other      Review of Systems  Constitutional: Negative.  Negative for chills and fever.  HENT: Negative.  Negative for congestion and sore throat.   Respiratory: Negative.  Negative for cough and shortness of breath.   Cardiovascular: Negative.  Negative for chest pain and palpitations.  Gastrointestinal:  Negative for abdominal pain, nausea  and vomiting.  Genitourinary: Negative.   Skin: Negative.  Negative for rash.  Neurological:  Positive for headaches.  All other systems reviewed and are negative.  Today's Vitals   01/21/22 0847 01/21/22 0852  BP: (!) 150/98 (!) 156/100  Pulse: 84   Temp: 98.2 F (36.8 C)   TempSrc: Oral   SpO2: 99%   Weight: 154 lb 6 oz (70 kg)   Height: '5\' 1"'$  (1.549 m)    Body mass index is 29.17 kg/m.   Physical Exam Vitals reviewed.  Constitutional:      Appearance: Normal appearance. She is well-developed.  HENT:     Head: Normocephalic.     Mouth/Throat:     Mouth:  Mucous membranes are moist.     Pharynx: Oropharynx is clear.  Eyes:     Extraocular Movements: Extraocular movements intact.     Conjunctiva/sclera: Conjunctivae normal.     Pupils: Pupils are equal, round, and reactive to light.  Cardiovascular:     Rate and Rhythm: Normal rate and regular rhythm.     Pulses: Normal pulses.     Heart sounds: Normal heart sounds.  Pulmonary:     Effort: Pulmonary effort is normal.     Breath sounds: Normal breath sounds.  Musculoskeletal:     Cervical back: No tenderness.  Lymphadenopathy:     Cervical: No cervical adenopathy.  Skin:    General: Skin is warm and dry.     Capillary Refill: Capillary refill takes less than 2 seconds.  Neurological:     General: No focal deficit present.     Mental Status: She is alert and oriented to person, place, and time.  Psychiatric:        Mood and Affect: Mood normal.        Behavior: Behavior normal.     ASSESSMENT & PLAN: A total of 44 minutes was spent with the patient and counseling/coordination of care regarding preparing for this visit, review of most recent office visit notes, review of most recent blood work results, diagnosis of hypertension and cardiovascular risks associated with this condition, dietary approaches to stop hypertension, need to start medication today, need for blood work today, prognosis, documentation and need for follow-up in 4 weeks.  Problem List Items Addressed This Visit       Cardiovascular and Mediastinum   Essential hypertension - Primary    New onset hypertension most likely cause of recurring chronic headaches. Cardiovascular risks associated with hypertension discussed. Dietary approaches to stop hypertension discussed. Needs blood work today. Will start valsartan-12.5 mg-HCTZ 80-12 0.5 daily Advised to monitor blood pressure readings at home daily for the next several weeks and keep a log.  Advised to contact the office if numbers persistently  elevated. Follow-up in 4 weeks.      Relevant Medications   valsartan-hydrochlorothiazide (DIOVAN-HCT) 80-12.5 MG tablet   Other Relevant Orders   CBC with Differential/Platelet   Comprehensive metabolic panel   Hemoglobin A1c   Lipid panel   TSH     Other   Chronic tension-type headache, not intractable    No red flag signs or symptoms.  No neurological deficits or abnormal findings on physical examination.  Most likely related to uncontrolled hypertension. May take Advil or Tylenol as needed for headaches We will start antihypertensive medication today and follow-up in 4 weeks.      Patient Instructions  Hipertensin en los adultos Hypertension, Adult El trmino hipertensin es otra forma de denominar a la presin arterial  elevada. La presin arterial elevada fuerza al corazn a trabajar ms para bombear la sangre. Esto puede causar problemas con el paso del [place]. Una lectura de presin arterial est compuesta por 2 nmeros. Hay un nmero superior (sistlico) sobre un nmero inferior (diastlico). Lo ideal es tener la presin arterial por debajo de 120/80. Cules son las causas? Se desconoce la causa de esta afeccin. Algunas otras afecciones pueden provocar presin arterial elevada. Qu incrementa el riesgo? Algunos factores del estilo de vida pueden hacer que tenga ms probabilidades de desarrollar presin arterial elevada: Fumar. No hacer la cantidad suficiente de actividad fsica o ejercicio. Tener sobrepeso. Consumir mucha grasa, azcar, caloras o sal (sodio) en su dieta. Beber alcohol en exceso. Otros factores de riesgo son los siguientes: Tener alguna de estas afecciones: Enfermedad cardaca. Diabetes. Colesterol alto. Enfermedad renal. Apnea obstructiva del sueo. Tener antecedentes familiares de presin arterial elevada y colesterol elevado. Edad. El riesgo aumenta con la edad. Estrs. Cules son los signos o sntomas? Es posible que la presin arterial  alta no cause sntomas. La presin arterial muy alta (crisis hipertensiva) puede provocar: Dolor de cabeza. Latidos cardacos acelerados o irregulares (palpitaciones). Falta de aire. Hemorragia nasal. Vomitar o sentir ganas de vomitar (nuseas). Cambios en la forma de ver. Dolor muy intenso en el pecho. Sensacin de [employer]. Convulsiones. Cmo se trata? Esta afeccin se trata haciendo cambios saludables en el estilo de vida, por ejemplo: Consumir alimentos saludables. Hacer ms ejercicio. Beber menos alcohol. El mdico puede recetarle medicamentos si los cambios en el estilo de vida no son lo suficientemente eficaces y si: El nmero de arriba est por encima de 130. El nmero de abajo est por encima de 80. Su presin arterial personal ideal puede variar. Siga estas indicaciones en su casa: Comida y bebida  Si se lo dicen, siga el plan de alimentacin de DASH (Dietary Approaches to Stop Hypertension, Maneras de alimentarse para detener la hipertensin). Para seguir este plan: Llene la mitad del plato de cada comida con frutas y verduras. Llene un cuarto del plato de cada comida con cereales integrales. Los cereales integrales incluyen pasta integral, arroz integral y pan integral. Coma y beba productos lcteos con bajo contenido de grasa, como leche descremada o yogur bajo en grasas. Llene un cuarto del plato de cada comida con protenas bajas en grasa (magras). Las protenas bajas en grasa incluyen pescado, pollo sin piel, huevos, frijoles y tofu. Evite consumir carne grasa, carne curada y procesada, o pollo con piel. Evite consumir alimentos prehechos o procesados. Limite la cantidad de sal en su dieta a menos de 1500 mg por da. No beba alcohol si: El mdico le indica que no lo haga. Est embarazada, puede estar embarazada o est tratando de [place]. Si bebe alcohol: Limite la cantidad que bebe a lo siguiente: De 0 a 1 medida por da para las mujeres. De 0 a 2 medidas  por da para los hombres. Sepa cunta cantidad de alcohol hay en las bebidas que toma. En los [place], una medida equivale a una botella de cerveza de 12 oz (355 ml), un vaso de vino de 5 oz (148 ml) o un vaso de una bebida alcohlica de alta graduacin de 1 oz (44 ml). Estilo de vida  Trabaje con su mdico para mantenerse en un peso saludable o para perder peso. Pregntele a su mdico cul es el peso recomendable para usted. Realice al menos 30 minutos de ejercicio que haga que se acelere su corazn (ejercicio [place])  la [employer] de la [place]. Estos pueden incluir caminar, nadar o andar en bicicleta. Realice al menos 30 minutos de ejercicio que fortalezca sus msculos (ejercicios de resistencia) al menos 3 das a la [place]. Estos pueden incluir levantar pesas o hacer [place]. No fume ni consuma ningn producto que contenga nicotina o tabaco. Si necesita ayuda para dejar de consumir estos productos, consulte al mdico. Controle su presin arterial en su casa tal como le indic el mdico. Concurra a todas las visitas de seguimiento. Medicamentos Use los medicamentos de venta libre y los recetados solamente como se lo haya indicado el mdico. Siga cuidadosamente las indicaciones. No omita las dosis de medicamentos para la presin arterial. Los medicamentos pierden eficacia si omite dosis. El hecho de omitir las dosis tambin [place] el riesgo de otros problemas. Pregntele a su mdico a qu efectos secundarios o reacciones a los Careers information officer. Comunquese con un mdico si: Piensa que tiene [place] reaccin a los medicamentos que est tomando. Tiene dolores de cabeza frecuentes. Siente mareos. Tiene hinchazn en los tobillos. Tiene problemas de visin. Solicite ayuda de inmediato si: Siente un dolor de cabeza muy intenso. Empieza a sentirse desorientado (confundido). Se siente dbil o adormecido. Siente que va a desmayarse. Tiene un dolor muy intenso  en: Pecho. Vientre (abdomen). Vomita ms de una vez. Tiene dificultad para respirar. Estos sntomas pueden Sales executive. Solicite ayuda de inmediato. Llame al 911. No espere a ver si los sntomas desaparecen. No conduzca por sus propios medios [employer]. Resumen El trmino hipertensin es otra forma de denominar a la presin arterial elevada. La presin arterial elevada fuerza al corazn a trabajar ms para bombear la sangre. Para la [employer], una presin arterial normal es menor que 120/80. Las decisiones saludables pueden ayudarle a disminuir su presin arterial. Si no puede bajar su presin arterial mediante decisiones saludables, es posible que deba tomar medicamentos. Esta informacin no tiene Marine scientist el consejo del mdico. Asegrese de hacerle al mdico cualquier pregunta que tenga. Document Revised: 02/27/2021 Document Reviewed: 02/27/2021 Elsevier Patient Education  [place], MD [facility]

## 2022-01-21 NOTE — Patient Instructions (Signed)
Hipertensin en los adultos Hypertension, Adult El trmino hipertensin es otra forma de denominar a la presin arterial elevada. La presin arterial elevada fuerza al corazn a trabajar ms para bombear la sangre. Esto puede causar problemas con el paso del tiempo. Una lectura de presin arterial est compuesta por 2 nmeros. Hay un nmero superior (sistlico) sobre un nmero inferior (diastlico). Lo ideal es tener la presin arterial por debajo de 120/80. Cules son las causas? Se desconoce la causa de esta afeccin. Algunas otras afecciones pueden provocar presin arterial elevada. Qu incrementa el riesgo? Algunos factores del estilo de vida pueden hacer que tenga ms probabilidades de desarrollar presin arterial elevada: Fumar. No hacer la cantidad suficiente de actividad fsica o ejercicio. Tener sobrepeso. Consumir mucha grasa, azcar, caloras o sal (sodio) en su dieta. Beber alcohol en exceso. Otros factores de riesgo son los siguientes: Tener alguna de estas afecciones: Enfermedad cardaca. Diabetes. Colesterol alto. Enfermedad renal. Apnea obstructiva del sueo. Tener antecedentes familiares de presin arterial elevada y colesterol elevado. Edad. El riesgo aumenta con la edad. Estrs. Cules son los signos o sntomas? Es posible que la presin arterial alta no cause sntomas. La presin arterial muy alta (crisis hipertensiva) puede provocar: Dolor de cabeza. Latidos cardacos acelerados o irregulares (palpitaciones). Falta de aire. Hemorragia nasal. Vomitar o sentir ganas de vomitar (nuseas). Cambios en la forma de ver. Dolor muy intenso en el pecho. Sensacin de mareo. Convulsiones. Cmo se trata? Esta afeccin se trata haciendo cambios saludables en el estilo de vida, por ejemplo: Consumir alimentos saludables. Hacer ms ejercicio. Beber menos alcohol. El mdico puede recetarle medicamentos si los cambios en el estilo de vida no son lo suficientemente  eficaces y si: El nmero de arriba est por encima de 130. El nmero de abajo est por encima de 80. Su presin arterial personal ideal puede variar. Siga estas indicaciones en su casa: Comida y bebida  Si se lo dicen, siga el plan de alimentacin de DASH (Dietary Approaches to Stop Hypertension, Maneras de alimentarse para detener la hipertensin). Para seguir este plan: Llene la mitad del plato de cada comida con frutas y verduras. Llene un cuarto del plato de cada comida con cereales integrales. Los cereales integrales incluyen pasta integral, arroz integral y pan integral. Coma y beba productos lcteos con bajo contenido de grasa, como leche descremada o yogur bajo en grasas. Llene un cuarto del plato de cada comida con protenas bajas en grasa (magras). Las protenas bajas en grasa incluyen pescado, pollo sin piel, huevos, frijoles y tofu. Evite consumir carne grasa, carne curada y procesada, o pollo con piel. Evite consumir alimentos prehechos o procesados. Limite la cantidad de sal en su dieta a menos de 1500 mg por da. No beba alcohol si: El mdico le indica que no lo haga. Est embarazada, puede estar embarazada o est tratando de quedar embarazada. Si bebe alcohol: Limite la cantidad que bebe a lo siguiente: De 0 a 1 medida por da para las mujeres. De 0 a 2 medidas por da para los hombres. Sepa cunta cantidad de alcohol hay en las bebidas que toma. En los [place], una medida equivale a una botella de cerveza de 12 oz (355 ml), un vaso de vino de 5 oz (148 ml) o un vaso de una bebida alcohlica de alta graduacin de 1 oz (44 ml). Estilo de vida  Trabaje con su mdico para mantenerse en un peso saludable o para perder peso. Pregntele a su mdico cul es el peso recomendable para   usted. Realice al menos 30 minutos de ejercicio que haga que se acelere su corazn (ejercicio aerbico) la mayora de los das de la semana. Estos pueden incluir caminar, nadar o andar en  bicicleta. Realice al menos 30 minutos de ejercicio que fortalezca sus msculos (ejercicios de resistencia) al menos 3 das a la semana. Estos pueden incluir levantar pesas o hacer Pilates. No fume ni consuma ningn producto que contenga nicotina o tabaco. Si necesita ayuda para dejar de consumir estos productos, consulte al mdico. Controle su presin arterial en su casa tal como le indic el mdico. Concurra a todas las visitas de seguimiento. Medicamentos Use los medicamentos de venta libre y los recetados solamente como se lo haya indicado el mdico. Siga cuidadosamente las indicaciones. No omita las dosis de medicamentos para la presin arterial. Los medicamentos pierden eficacia si omite dosis. El hecho de omitir las dosis tambin aumenta el riesgo de otros problemas. Pregntele a su mdico a qu efectos secundarios o reacciones a los medicamentos debe prestar atencin. Comunquese con un mdico si: Piensa que tiene una reaccin a los medicamentos que est tomando. Tiene dolores de cabeza frecuentes. Siente mareos. Tiene hinchazn en los tobillos. Tiene problemas de visin. Solicite ayuda de inmediato si: Siente un dolor de cabeza muy intenso. Empieza a sentirse desorientado (confundido). Se siente dbil o adormecido. Siente que va a desmayarse. Tiene un dolor muy intenso en: Pecho. Vientre (abdomen). Vomita ms de una vez. Tiene dificultad para respirar. Estos sntomas pueden indicar una emergencia. Solicite ayuda de inmediato. Llame al 911. No espere a ver si los sntomas desaparecen. No conduzca por sus propios medios hasta el hospital. Resumen El trmino hipertensin es otra forma de denominar a la presin arterial elevada. La presin arterial elevada fuerza al corazn a trabajar ms para bombear la sangre. Para la mayora de las personas, una presin arterial normal es menor que 120/80. Las decisiones saludables pueden ayudarle a disminuir su presin arterial. Si no puede  bajar su presin arterial mediante decisiones saludables, es posible que deba tomar medicamentos. Esta informacin no tiene como fin reemplazar el consejo del mdico. Asegrese de hacerle al mdico cualquier pregunta que tenga. Document Revised: 02/27/2021 Document Reviewed: 02/27/2021 Elsevier Patient Education  2023 Elsevier Inc.  

## 2022-02-05 ENCOUNTER — Encounter: Payer: Self-pay | Admitting: Physician Assistant

## 2022-02-05 ENCOUNTER — Ambulatory Visit (INDEPENDENT_AMBULATORY_CARE_PROVIDER_SITE_OTHER): Payer: Self-pay | Admitting: Physician Assistant

## 2022-02-05 VITALS — BP 110/80 | HR 114 | Temp 98.0°F | Ht 61.0 in | Wt 150.4 lb

## 2022-02-05 DIAGNOSIS — J101 Influenza due to other identified influenza virus with other respiratory manifestations: Secondary | ICD-10-CM

## 2022-02-05 LAB — POCT INFLUENZA A/B
Influenza A, POC: NEGATIVE
Influenza B, POC: POSITIVE — AB

## 2022-02-05 LAB — POCT RAPID STREP A (OFFICE): Rapid Strep A Screen: NEGATIVE

## 2022-02-05 LAB — POC COVID19 BINAXNOW: SARS Coronavirus 2 Ag: NEGATIVE

## 2022-02-05 MED ORDER — OSELTAMIVIR PHOSPHATE 75 MG PO CAPS: 75.0000 mg | ORAL_CAPSULE | Freq: Two times a day (BID) | ORAL | 0 refills | Status: DC

## 2022-02-05 NOTE — Progress Notes (Signed)
Jenny Wagner is a 41 y.o. female here for a new problem.  History of Present Illness:   Chief Complaint  Patient presents with   Cough    Started 2 days ago with phlegm    Sore Throat   Fever   Generalized Body Aches   Chest Congestion    Sx started 2 days    HPI  Cough/sore throat: She complains of chest pain when coughing and sore throat, which started on 02/03/2022. She denies of taking any OTC medication. She mentions of being exposed to Influenza by a friend over the weekend. She is eating and drinking well. Denies n/v/d, swelling of legs, chest pain or SOB with exerion, concerns for pregnancy.  Past Medical History:  Diagnosis Date   Depression      Social History   Tobacco Use   Smoking status: Never   Smokeless tobacco: Never  Vaping Use   Vaping Use: Never used  Substance Use Topics   Alcohol use: No   Drug use: No    Past Surgical History:  Procedure Laterality Date   CHOLECYSTECTOMY  2007    Family History  Problem Relation Age of Onset   Hypertension Other    Diabetes Other     No Known Allergies  Current Medications:   Current Outpatient Medications:    oseltamivir (TAMIFLU) 75 MG capsule, Take 1 capsule (75 mg total) by mouth 2 (two) times daily., Disp: 10 capsule, Rfl: 0   valsartan-hydrochlorothiazide (DIOVAN-HCT) 80-12.5 MG tablet, Take 1 tablet by mouth daily., Disp: 90 tablet, Rfl: 3   albuterol (VENTOLIN HFA) 108 (90 Base) MCG/ACT inhaler, Inhale 2 puffs into the lungs every 6 (six) hours as needed for wheezing or shortness of breath. (Patient not taking: Reported on 01/21/2022), Disp: 1 each, Rfl: 0   ferrous sulfate 325 (65 FE) MG tablet, Take 1 tablet (325 mg total) by mouth daily with breakfast. (Patient not taking: Reported on 01/21/2022), Disp: 45 tablet, Rfl: 0   Multiple Vitamin (MULTI-VITAMIN) tablet, Take 1 tablet by mouth daily. (Patient not taking: Reported on 01/21/2022), Disp: , Rfl:    tranexamic acid (LYSTEDA) 650 MG TABS  tablet, Take 2 tablets (1,300 mg total) by mouth 3 (three) times daily. Take during menses for a maximum of five days (Patient not taking: Reported on 01/21/2022), Disp: 30 tablet, Rfl: 2   Review of Systems:   Review of Systems  HENT:  Positive for sore throat.   Respiratory:  Positive for cough.   Cardiovascular:  Positive for chest pain.    Vitals:   Vitals:   02/05/22 1105  BP: 110/80  Pulse: (!) 114  Temp: 98 F (36.7 C)  TempSrc: Temporal  SpO2: 97%  Weight: 150 lb 6 oz (68.2 kg)  Height: '5\' 1"'$  (1.549 m)     Body mass index is 28.41 kg/m.  Physical Exam:   Physical Exam Constitutional:      General: She is not in acute distress.    Appearance: Normal appearance. She is not ill-appearing.  HENT:     Head: Normocephalic and atraumatic.     Right Ear: External ear normal.     Left Ear: External ear normal.  Eyes:     Extraocular Movements: Extraocular movements intact.     Pupils: Pupils are equal, round, and reactive to light.  Cardiovascular:     Rate and Rhythm: Regular rhythm. Tachycardia present.     Heart sounds: Normal heart sounds. No murmur heard.    No  gallop.  Pulmonary:     Effort: Pulmonary effort is normal. No respiratory distress.     Breath sounds: Normal breath sounds. No wheezing or rales.  Skin:    General: Skin is warm and dry.  Neurological:     Mental Status: She is alert and oriented to person, place, and time.  Psychiatric:        Judgment: Judgment normal.    Results for orders placed or performed in visit on 02/05/22  POC COVID-19  Result Value Ref Range   SARS Coronavirus 2 Ag Negative Negative  POCT rapid strep A  Result Value Ref Range   Rapid Strep A Screen Negative Negative  POCT Influenza A/B  Result Value Ref Range   Influenza A, POC Negative Negative   Influenza B, POC Positive (A) Negative    Assessment and Plan:   Acute cough Flu B is positive Start tamiflu Push fluids and rest Inform contacts of diagnosis  and possible exposure in case they would like to do prophylaxis Worsening precautions advised Recommend NSAIDs for a few days for chest wall inflammation   I,Param Shah,acting as a scribe for [employer], PA.,have documented all relevant documentation on the behalf of Inda Coke, PA,as directed by  Inda Coke, PA while in the presence of Inda Coke, [place].  I, Inda Coke, [place], have reviewed all documentation for this visit. The documentation on 02/05/22 for the exam, diagnosis, procedures, and orders are all accurate and complete.   Inda Coke, PA-C

## 2022-02-05 NOTE — Patient Instructions (Addendum)
It was great to see you!  You have the flu. Please start Tamiflu. Tell others that you have been in contact with that you have the flu in case they want to start medication to prevent the flu  Take ibuprofen 400-600 mg every 8 hours for a few days to calm down the inflammation in your chest  If any worsening symptoms, please call us or PCP.  Take care,  Inda Coke PA-C

## 2022-02-13 ENCOUNTER — Encounter: Payer: Self-pay | Admitting: Family Medicine

## 2022-02-13 ENCOUNTER — Ambulatory Visit (INDEPENDENT_AMBULATORY_CARE_PROVIDER_SITE_OTHER): Payer: Self-pay | Admitting: Family Medicine

## 2022-02-13 VITALS — BP 116/80 | HR 77 | Temp 98.7°F | Wt 151.8 lb

## 2022-02-13 DIAGNOSIS — R058 Other specified cough: Secondary | ICD-10-CM

## 2022-02-13 DIAGNOSIS — J4521 Mild intermittent asthma with (acute) exacerbation: Secondary | ICD-10-CM

## 2022-02-13 MED ORDER — BENZONATATE 200 MG PO CAPS: 200.0000 mg | ORAL_CAPSULE | Freq: Three times a day (TID) | ORAL | 0 refills | Status: AC | PRN

## 2022-02-13 MED ORDER — PREDNISONE 50 MG PO TABS: ORAL_TABLET | ORAL | 0 refills | Status: DC

## 2022-02-13 NOTE — Patient Instructions (Signed)
It was a pleasure to see you today! Thank you for choosing [facility] for your primary care. Jenny Wagner was seen for cough after flu. Come back to the clinic if no improvement in 1 week, and go to the emergency room if you get severe chest pain, shortness of breath, excessive dizziness, or any other worrisome symptom.   Best,  Marny Lowenstein, MD, MS  02/13/2022 11:17 AM

## 2022-02-13 NOTE — Progress Notes (Signed)
Assessment/Plan:   Problem List Items Addressed This Visit       Respiratory   Mild reactive airways disease - Primary    Patient with postviral cough history of RAD No wheezing on exam and normal pulse ox, But given improvement with albuterol, Recommend short course of steroids and continuation of albuterol We will also trial Tessalon Perles to help with cough Cough is associated with some lightheadedness that quickly resolves after rest and deep breathing., I believe that this is likely due to increased intracranial pressure from cough.  With controlled cough, likely to improve. Patient to follow-up if no improvement Go to ED if severe.      Relevant Medications   predniSONE (DELTASONE) 50 MG tablet   benzonatate (TESSALON) 200 MG capsule   Other Visit Diagnoses     Post-viral cough syndrome       Relevant Medications   predniSONE (DELTASONE) 50 MG tablet   benzonatate (TESSALON) 200 MG capsule          Subjective:  HPI:  Jenny Wagner is a 41 y.o. female who has Seborrheic dermatitis; Mild reactive airways disease; Menorrhagia with regular cycle; Fibroid; Essential hypertension; and Chronic tension-type headache, not intractable on their problem list..   She  has a past medical history of Depression..   She presents with chief complaint of Cough (Patient dx with flu 5 days ago. She states her cough will not go away. Patient states that she feels dizzy and lightheaded after coughing. ) .   Patient reports ongoing cough.  Patient seen by PCP approximately 5 days ago and was diagnosed with flu.  Patient also with history of RAD.  She has finished course of Tamiflu.  She reports ongoing cough and mild wheezing predominantly at night.  Wheezing is improved with as needed albuterol when she coughs she does feel slightly lightheaded and feels like she needs to sit down.  Quickly has return of normal faculties.  Patient denies chest pain, shortness of breath, palpitation,  fevers, chills, blurry vision, headache.   Past Surgical History:  Procedure Laterality Date   CHOLECYSTECTOMY  2007    Outpatient Medications Prior to Visit  Medication Sig Dispense Refill   valsartan-hydrochlorothiazide (DIOVAN-HCT) 80-12.5 MG tablet Take 1 tablet by mouth daily. 90 tablet 3   albuterol (VENTOLIN HFA) 108 (90 Base) MCG/ACT inhaler Inhale 2 puffs into the lungs every 6 (six) hours as needed for wheezing or shortness of breath. (Patient not taking: Reported on 01/21/2022) 1 each 0   ferrous sulfate 325 (65 FE) MG tablet Take 1 tablet (325 mg total) by mouth daily with breakfast. (Patient not taking: Reported on 01/21/2022) 45 tablet 0   Multiple Vitamin (MULTI-VITAMIN) tablet Take 1 tablet by mouth daily. (Patient not taking: Reported on 01/21/2022)     oseltamivir (TAMIFLU) 75 MG capsule Take 1 capsule (75 mg total) by mouth 2 (two) times daily. (Patient not taking: Reported on 02/13/2022) 10 capsule 0   tranexamic acid (LYSTEDA) 650 MG TABS tablet Take 2 tablets (1,300 mg total) by mouth 3 (three) times daily. Take during menses for a maximum of five days (Patient not taking: Reported on 01/21/2022) 30 tablet 2   No facility-administered medications prior to visit.    Family History  Problem Relation Age of Onset   Hypertension Other    Diabetes Other     Social History   Socioeconomic History   Marital status: Married    Spouse name: Not on file   Number  of children: 2   Years of education: Not on file   Highest education level: Not on file  Occupational History   Occupation: Stay at home mom  Tobacco Use   Smoking status: Never   Smokeless tobacco: Never  Vaping Use   Vaping Use: Never used  Substance and Sexual Activity   Alcohol use: No   Drug use: No   Sexual activity: Yes    Birth control/protection: None  Other Topics Concern   Not on file  Social History Narrative   Not on file   Social Determinants of Health   Financial Resource Strain: Not  on file  Food Insecurity: Not on file  Transportation Needs: Not on file  Physical Activity: Not on file  Stress: Not on file  Social Connections: Not on file  Intimate Partner Violence: Not on file                                                                                                 Objective:  Physical Exam: BP 116/80 (BP Location: Right Arm, Patient Position: Sitting, Cuff Size: Large)   Pulse 77   Temp 98.7 F (37.1 C) (Oral)   Wt 151 lb 12.8 oz (68.9 kg)   LMP 01/30/2022   SpO2 98%   BMI 28.68 kg/m    General: No acute distress. Awake and conversant.  Eyes: Normal conjunctiva, anicteric. Round symmetric pupils.  ENT: Hearing grossly intact. No nasal discharge.  Neck: Neck is supple. No masses or thyromegaly.  Respiratory: Respirations are non-labored. No auditory wheezing.  CTA B Skin: Warm. No rashes or ulcers.  Psych: Alert and oriented. Cooperative, Appropriate mood and affect, Normal judgment.  CV: No cyanosis or JVD, RRR, no MRG ABD: Nontender nondistended MSK: Normal ambulation. No clubbing  Neuro: Sensation and CN II-XII grossly normal.        Alesia Banda, MD, MS

## 2022-02-13 NOTE — Assessment & Plan Note (Signed)
Patient with postviral cough history of RAD No wheezing on exam and normal pulse ox, But given improvement with albuterol, Recommend short course of steroids and continuation of albuterol We will also trial Tessalon Perles to help with cough Cough is associated with some lightheadedness that quickly resolves after rest and deep breathing., I believe that this is likely due to increased intracranial pressure from cough.  With controlled cough, likely to improve. Patient to follow-up if no improvement Go to ED if severe.

## 2022-02-16 ENCOUNTER — Telehealth: Payer: Self-pay

## 2022-02-16 NOTE — Telephone Encounter (Signed)
Nurse Assessment Nurse: Glennon Mac, RN, Cecile Date/Time (Eastern Time): 02/07/2022 10:06:49 PM Confirm and document reason for call. If symptomatic, describe symptoms. ---Caller states she has flu. HX of high blood pressure. Wants to know what she can safely take for flu w/o raising BP. Coughing and congestion, muscle aches, chills, denies fever. Was diagnosed on the 5th, Thursday. States was prescribed Tamiflu. Does the patient have any new or worsening symptoms? ---Yes Will a triage be completed? ---Yes Related visit to physician within the last 2 weeks? ---Yes Does the PT have any chronic conditions? (i.e. diabetes, asthma, this includes High risk factors for pregnancy, etc.) ---Yes List chronic conditions. ---HTN Is the patient pregnant or possibly pregnant? (Ask all females between the ages of 73-55) ---No Is this a behavioral health or substance abuse call? ---No Guidelines Guideline Title Affirmed Question Affirmed Notes Nurse Date/Time Eilene Ghazi Time) Influenza (Flu) - Seasonal Kathee Polite, RN, [place] 02/07/2022 10:09:41 PM Disp. Time Eilene Ghazi Time) Disposition Final User 02/07/2022 10:22:08 PM See PCP within 24 Hours Yes Glennon Mac, RN, Regino Ramirez   Final Disposition 02/07/2022 10:22:08 PM See PCP within 24 Hours Yes Glennon Mac, RN, Cecile Caller Disagree/Comply Comply Caller Understands Yes PreDisposition Go to Urgent Care/Walk-In Clinic Care Advice Given Per Guideline SEE PCP WITHIN 24 HOURS: * IF OFFICE WILL BE OPEN: You need to be examined within the next 24 hours. Call your doctor (or NP/PA) when the office opens and make an appointment. * Cough: Use cough drops. * Feeling dehydrated: Drink extra liquids. If the air in your home is dry, use a humidifier. * Fever: For fever over 101 F (38.3 C), take acetaminophen every 4 to 6 hours (Adults 650 mg) OR ibuprofen every 6 to 8 hours (Adults 400 mg). INFLUENZA - GENERAL CARE ADVICE: * Muscle aches, headache, and other pains: Often this comes  and goes with the fever. Take acetaminophen every 4 to 6 hours (Adults 650 mg) OR ibuprofen every 6 to 8 hours (Adults 400 mg). PAIN AND FEVER MEDICINES: * For pain or fever relief, take either acetaminophen or ibuprofen. * They are over-the-counter (OTC) drugs that help treat both fever and pain. You can buy them at the drugstore. NO ASPIRIN: * Do not use aspirin for treatment of fever or pain. FOR A RUNNY NOSE - BLOW YOUR NOSE: * Nasal mucus and discharge help wash viruses and bacteria out of the nose and sinuses. NASAL WASHES FOR A STUFFY NOSE: * Introduction: Saline (salt water) nasal irrigation (nasal wash) is an effective and simple home remedy for treating stuffy nose and sinus congestion. The nose can be irrigated by pouring, spraying, or squirting salt water into the nose and then letting it run back out. NASAL WASHES - STEP-BY-STEP INSTRUCTIONS: * STEP 1: Lean over a sink. * STEP 2: Gently squirt or spray warm salt water into one of your nostrils. HOW TO MAKE SALINE (SALT WATER) NASAL WASH: * Put 1 cup (8 oz; 240 ml) of water in a clean container. * Add 3/4 teaspoon of non-iodized salt (such as canning or pickling salt) to the water. * Add 1/4 teaspoon baking soda to the water. Stir well. CALL BACK IF: * Fever over 104 F (40 C) * Difficulty breathing occurs * You become worse CARE ADVICE given per Influenza - Seasonal (Adult) guideline. * STEP 3: Some of the water may run into the back of your throat. Spit this out. If you swallow the salt water it will not hurt you. * STEP 4: Blow your nose to  clean out the water and mucus. * STEP 5: Repeat steps 1 through 4 for the other nostril. You can do this a couple times a day if it seems to help you. * Use distilled water or boiled tap water that has cooled. * Throw away any unused saline nasal wash after 24 hours. * Reason: there is an association between influenza and Arvin Collard' Syndrome. * ACETAMINOPHEN - EXTRA STRENGTH TYLENOL: Take 1,000 mg (two 500  mg pills) every 6 to 8 hours as needed. Each Extra Strength Tylenol pill has 500 mg of acetaminophen. The most you should take is 6 pills a day (3,000 mg total). Note: In [place], the maximum is 8 pills a day (4,000 mg total). * ACETAMINOPHEN - REGULAR STRENGTH TYLENOL: Take 650 mg (two 325 mg pills) by mouth every 4 to 6 hours as needed. Each Regular Strength Tylenol pill has 325 mg of acetaminophen. The most you should take is 10 pills a day (3,250 mg total). Note: In [place], the maximum is 12 pills a day (3,900 mg total). Comments User: Dorcas Mcmurray, RN Date/Time Eilene Ghazi Time): 02/07/2022 10:11:33 PM COVID AND STREP NEGATIVE Referrals REFERRED TO PCP OFFICE

## 2022-02-18 ENCOUNTER — Ambulatory Visit: Payer: 59 | Admitting: Emergency Medicine

## 2022-03-19 ENCOUNTER — Telehealth: Payer: Self-pay | Admitting: *Deleted

## 2022-03-19 NOTE — Telephone Encounter (Signed)
Transition Care Management Follow-up Telephone Call Date of discharge and from where: 03/18/2022 [facility] How have you been since you were released from the hospital? Patient states she is doing ok Any questions or concerns? No  Items Reviewed: Did the pt receive and understand the discharge instructions provided? Yes  Medications obtained and verified? Yes  Other?  N/a Any new allergies since your discharge? No  Dietary orders reviewed? No Do you have support at home? Yes   Home Care and Equipment/Supplies: Were home health services ordered? not applicable If so, what is the name of the agency? N/a  Has the agency set up a time to come to the patient's home? not applicable Were any new equipment or medical supplies ordered?  No What is the name of the medical supply agency? N/a Were you able to get the supplies/equipment? not applicable Do you have any questions related to the use of the equipment or supplies? No  Functional Questionnaire: (I = Independent and D = Dependent) ADLs: I  Bathing/Dressing- I  Meal Prep- I  Eating- I  Maintaining continence- I  Transferring/Ambulation- I  Managing Meds- I  Follow up appointments reviewed:  PCP Hospital f/u appt confirmed? Yes  Scheduled to see Dr. Mitchel Honour on 03/24/2022 @ 10:40am. [facility] f/u appt confirmed? No  Scheduled to see n/a on n/a @ n/a. Are transportation arrangements needed? No  If their condition worsens, is the pt aware to call PCP or go to the Emergency Dept.? Yes Was the patient provided with contact information for the PCP's office or ED? Yes Was to pt encouraged to call back with questions or concerns? Yes

## 2022-03-24 ENCOUNTER — Ambulatory Visit (INDEPENDENT_AMBULATORY_CARE_PROVIDER_SITE_OTHER): Payer: Self-pay | Admitting: Emergency Medicine

## 2022-03-24 ENCOUNTER — Encounter: Payer: Self-pay | Admitting: Emergency Medicine

## 2022-03-24 VITALS — BP 130/74 | HR 67 | Temp 98.5°F | Ht 61.0 in | Wt 156.1 lb

## 2022-03-24 DIAGNOSIS — M62830 Muscle spasm of back: Secondary | ICD-10-CM

## 2022-03-24 DIAGNOSIS — I10 Essential (primary) hypertension: Secondary | ICD-10-CM

## 2022-03-24 DIAGNOSIS — M7918 Myalgia, other site: Secondary | ICD-10-CM

## 2022-03-24 DIAGNOSIS — T07XXXA Unspecified multiple injuries, initial encounter: Secondary | ICD-10-CM

## 2022-03-24 DIAGNOSIS — S39012A Strain of muscle, fascia and tendon of lower back, initial encounter: Secondary | ICD-10-CM

## 2022-03-24 NOTE — Assessment & Plan Note (Signed)
Advised to use heat pad several times a day for the next few days. Continue muscle relaxant Robaxin 500 mg 3 times a day as needed

## 2022-03-24 NOTE — Assessment & Plan Note (Signed)
Pain management discussed.  Continue NSAID with muscle relaxant.  Tylenol as needed for pain.
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
## 2022-03-24 NOTE — Assessment & Plan Note (Signed)
Well-controlled hypertension. Continue Diovan HCT 80-12.5 mg daily. BP Readings from Last 3 Encounters:  03/24/22 130/74  02/13/22 116/80  02/05/22 110/80
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
## 2022-03-24 NOTE — Patient Instructions (Signed)
Dolor musculoesqueltico Musculoskeletal Pain "Dolor musculoesqueltico" hace referencia a los dolores y las [employer], las articulaciones, los msculos y los tejidos [employer] rodean. Este dolor puede ocurrir en cualquier parte del cuerpo. Puede durar un breve perodo (agudo) o prolongarse mucho tiempo (crnico). Es posible que se realicen un examen fsico, anlisis de laboratorio y estudios de diagnstico por imgenes para Animator causa del dolor musculoesqueltico. Siga estas instrucciones en su casa: Estilo de vida Trate de controlar o reducir los niveles de Psychologist, forensic. El estrs aumenta la tensin muscular y Biochemist, clinical musculoesqueltico. Es importante reconocer cuando est ansioso o estresado y aprender distintas formas de Market researcher. Esto puede incluir: Yoga o meditacin. Terapia cognitiva o conductual. Acupuntura o terapia de masajes. Podr seguir con todas las actividades, a menos que Magazine features editor generen ms [employer]. Cuando el dolor [place], retome las actividades habituales de a poco. Aumente gradualmente la intensidad y la duracin de las actividades o del ejercicio que realice. Control del dolor, la rigidez y la hinchazn     El tratamiento puede incluir medicamentos para Conservation officer, historic buildings y la inflamacin que se toman por boca o que se aplican sobre la piel. Use los medicamentos de venta libre y los recetados solamente como se lo haya indicado el mdico. Si el dolor es intenso, el reposo en cama puede ser beneficioso. Acustese o sintese en cualquier posicin que sea cmoda, pero salga de la cama y camine al [employer]. Si se lo indican, aplique calor en la zona afectada con la frecuencia que le haya indicado el mdico. Use la fuente de calor que el mdico le recomiende, como una compresa de calor hmedo o una almohadilla trmica. Coloque una toalla entre la piel y la fuente de Freight forwarder. Aplique calor durante 20 a 30 minutos. Retire la fuente de calor  si la piel se pone de color rojo brillante. Esto es especialmente importante si no puede sentir dolor, calor o fro. Puede correr un riesgo mayor de sufrir quemaduras. Si se lo indican, aplique hielo sobre la zona dolorida. Para hacer esto: Ponga el hielo en una bolsa plstica. Coloque una toalla entre la piel y Therapist, nutritional. Aplique el hielo durante 20 minutos, 2 o 3 veces por da. Retire el hielo si la piel se pone de color rojo brillante. Esto es [employer]. Si no puede sentir dolor, calor o fro, tiene un mayor riesgo de que se dae la zona. Indicaciones generales El mdico puede recomendarle que consulte a un fisioterapeuta. Esta persona puede ayudarlo a elaborar un programa de ejercicios seguro. Si se lo indica el mdico, haga ejercicios de fisioterapia para mejorar el movimiento y la fuerza de la zona afectada. Cumpla con todas las visitas de seguimiento. Esto es importante. [place] visitas al fisioterapeuta. Comunquese con un mdico si: El Holiday representative. Los medicamentos no Financial trader. No puede usar la parte del cuerpo que le duele, como un brazo, una pierna o el cuello. Tiene dificultad para dormir. Tiene dificultad para [employer] cotidianas. Solicite ayuda de inmediato si: Tiene una nueva lesin o el dolor empeora o es diferente. Tiene adormecimiento u hormigueo en la zona dolorida. Resumen "Dolor musculoesqueltico" hace referencia a los dolores y las [employer], las articulaciones, los msculos y los tejidos [employer] rodean. Este dolor puede ocurrir en cualquier parte del cuerpo. El mdico puede recomendarle que consulte a un fisioterapeuta. Esta persona puede ayudarlo a Scientist, water quality  de ejercicios seguro. Haga ejercicios como se lo haya indicado el fisioterapeuta. Disminuya los niveles de estrs. El estrs puede Occupational hygienist musculoesqueltico. [place] los mtodos para disminuir el estrs se pueden mencionar la meditacin, el  yoga, la terapia cognitiva o conductual, la acupuntura y la terapia de [place]. Esta informacin no tiene Marine scientist el consejo del mdico. Asegrese de hacerle al mdico cualquier pregunta que tenga. Document Revised: 10/19/2019 Document Reviewed: 10/19/2019 Elsevier Patient Education  [place].

## 2022-03-31 ENCOUNTER — Encounter (HOSPITAL_BASED_OUTPATIENT_CLINIC_OR_DEPARTMENT_OTHER): Payer: Self-pay | Admitting: Emergency Medicine

## 2022-03-31 ENCOUNTER — Emergency Department (HOSPITAL_BASED_OUTPATIENT_CLINIC_OR_DEPARTMENT_OTHER): Payer: Medicaid Other

## 2022-03-31 ENCOUNTER — Other Ambulatory Visit: Payer: Self-pay

## 2022-03-31 ENCOUNTER — Encounter: Payer: Self-pay | Admitting: Internal Medicine

## 2022-03-31 ENCOUNTER — Emergency Department (HOSPITAL_BASED_OUTPATIENT_CLINIC_OR_DEPARTMENT_OTHER)
Admission: EM | Admit: 2022-03-31 | Discharge: 2022-03-31 | Disposition: A | Discharge status: Home / Self Care | Payer: Self-pay | Attending: Emergency Medicine | Admitting: Emergency Medicine

## 2022-03-31 DIAGNOSIS — K5792 Diverticulitis of intestine, part unspecified, without perforation or abscess without bleeding: Secondary | ICD-10-CM

## 2022-03-31 DIAGNOSIS — N9489 Other specified conditions associated with female genital organs and menstrual cycle: Secondary | ICD-10-CM | POA: Diagnosis not present

## 2022-03-31 DIAGNOSIS — R1030 Lower abdominal pain, unspecified: Secondary | ICD-10-CM | POA: Diagnosis present

## 2022-03-31 DIAGNOSIS — K5732 Diverticulitis of large intestine without perforation or abscess without bleeding: Secondary | ICD-10-CM | POA: Insufficient documentation

## 2022-03-31 HISTORY — DX: Essential (primary) hypertension: I10

## 2022-03-31 LAB — URINALYSIS, ROUTINE W REFLEX MICROSCOPIC
Bilirubin Urine: NEGATIVE
Glucose, UA: NEGATIVE mg/dL
Hgb urine dipstick: NEGATIVE
Ketones, ur: NEGATIVE mg/dL
Leukocytes,Ua: NEGATIVE
Nitrite: NEGATIVE
Protein, ur: NEGATIVE mg/dL
Specific Gravity, Urine: 1.023 (ref 1.005–1.030)
pH: 7 (ref 5.0–8.0)

## 2022-03-31 LAB — COMPREHENSIVE METABOLIC PANEL
ALT: 26 U/L (ref 0–44)
AST: 21 U/L (ref 15–41)
Albumin: 4.3 g/dL (ref 3.5–5.0)
Alkaline Phosphatase: 64 U/L (ref 38–126)
Anion gap: 8 (ref 5–15)
BUN: 15 mg/dL (ref 6–20)
CO2: 24 mmol/L (ref 22–32)
Calcium: 9 mg/dL (ref 8.9–10.3)
Chloride: 105 mmol/L (ref 98–111)
Creatinine, Ser: 0.59 mg/dL (ref 0.44–1.00)
GFR, Estimated: >60 mL/min (ref >60)
Glucose, Bld: 101 mg/dL — ABNORMAL HIGH (ref 70–99)
Potassium: 4 mmol/L (ref 3.5–5.1)
Sodium: 137 mmol/L (ref 135–145)
Total Bilirubin: 0.2 mg/dL — ABNORMAL LOW (ref 0.3–1.2)
Total Protein: 7.2 g/dL (ref 6.5–8.1)

## 2022-03-31 LAB — CBC
HCT: 33.2 % — ABNORMAL LOW (ref 36.0–46.0)
Hemoglobin: 10 g/dL — ABNORMAL LOW (ref 12.0–15.0)
MCH: 23.4 pg — ABNORMAL LOW (ref 26.0–34.0)
MCHC: 30.1 g/dL (ref 30.0–36.0)
MCV: 77.8 fL — ABNORMAL LOW (ref 80.0–100.0)
Platelets: 309 10*3/uL (ref 150–400)
RBC: 4.27 MIL/uL (ref 3.87–5.11)
RDW: 18.7 % — ABNORMAL HIGH (ref 11.5–15.5)
WBC: 6.9 10*3/uL (ref 4.0–10.5)
nRBC: 0 % (ref 0.0–0.2)

## 2022-03-31 LAB — HCG, SERUM, QUALITATIVE: Preg, Serum: NEGATIVE

## 2022-03-31 LAB — LIPASE, BLOOD: Lipase: 32 U/L (ref 11–51)

## 2022-03-31 MED ORDER — LIDOCAINE 5 % EX PTCH
1.0000 | MEDICATED_PATCH | CUTANEOUS | Status: DC
  Administered 2022-03-31: 1 via TRANSDERMAL
  Filled 2022-03-31 (×2): qty 1

## 2022-03-31 MED ORDER — IOHEXOL 300 MG/ML  SOLN
100.0000 mL | Freq: Once | INTRAMUSCULAR | Status: AC | PRN
  Administered 2022-03-31: 80 mL via INTRAVENOUS

## 2022-03-31 MED ORDER — ONDANSETRON HCL 4 MG PO TABS: 4.0000 mg | ORAL_TABLET | Freq: Four times a day (QID) | ORAL | 0 refills | Status: DC

## 2022-03-31 MED ORDER — AMOXICILLIN-POT CLAVULANATE 875-125 MG PO TABS: 1.0000 | ORAL_TABLET | Freq: Two times a day (BID) | ORAL | 0 refills | Status: DC

## 2022-03-31 MED ORDER — KETOROLAC TROMETHAMINE 15 MG/ML IJ SOLN
15.0000 mg | Freq: Once | INTRAMUSCULAR | Status: AC
  Administered 2022-03-31: 15 mg via INTRAVENOUS
  Filled 2022-03-31: qty 1
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
## 2022-03-31 NOTE — ED Notes (Signed)
Discharge instructions, follow up care, and prescriptions reviewed and explained, pt verbalized understanding. Pt  had no further questions and was caox4, ambulatory on d/c.
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
## 2022-03-31 NOTE — ED Notes (Signed)
Pt to CT
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
## 2022-04-01 ENCOUNTER — Ambulatory Visit: Payer: Medicaid Other | Admitting: Internal Medicine

## 2022-04-01 ENCOUNTER — Telehealth: Payer: Self-pay | Admitting: *Deleted

## 2022-04-01 NOTE — Telephone Encounter (Signed)
Patient has an appt to see provider on 04/06/2022

## 2022-04-06 ENCOUNTER — Encounter: Payer: Self-pay | Admitting: Emergency Medicine

## 2022-04-06 ENCOUNTER — Ambulatory Visit (INDEPENDENT_AMBULATORY_CARE_PROVIDER_SITE_OTHER): Payer: Self-pay | Admitting: Emergency Medicine

## 2022-04-06 VITALS — BP 122/82 | HR 53 | Temp 97.8°F | Ht 61.0 in | Wt 151.4 lb

## 2022-04-06 DIAGNOSIS — M545 Low back pain, unspecified: Secondary | ICD-10-CM | POA: Insufficient documentation

## 2022-04-06 DIAGNOSIS — K5792 Diverticulitis of intestine, part unspecified, without perforation or abscess without bleeding: Secondary | ICD-10-CM | POA: Insufficient documentation

## 2022-04-06 DIAGNOSIS — I10 Essential (primary) hypertension: Secondary | ICD-10-CM

## 2022-04-06 DIAGNOSIS — N920 Excessive and frequent menstruation with regular cycle: Secondary | ICD-10-CM

## 2022-04-06 DIAGNOSIS — D219 Benign neoplasm of connective and other soft tissue, unspecified: Secondary | ICD-10-CM

## 2022-04-06 NOTE — Assessment & Plan Note (Signed)
Creating menorrhagia and chronic anemia Need follow-up with gynecologist

## 2022-04-06 NOTE — Assessment & Plan Note (Signed)
Well-controlled hypertension. Continue Diovan-HCTZ 80-12.5 mg daily. BP Readings from Last 3 Encounters:  04/06/22 122/82  03/31/22 (!) 115/59  03/24/22 130/74
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
## 2022-04-06 NOTE — Assessment & Plan Note (Signed)
Improving.  About 60% better. Clinically better.  Afebrile. No red flag signs or symptoms. Able to eat and drink.  No nausea or vomiting. Continue Augmentin 875 mg twice a day. Recommend GI evaluation and possible colonoscopy. Referral placed today.

## 2022-04-06 NOTE — Assessment & Plan Note (Signed)
Secondary to uterine fibroid. Recommend to follow-up with gynecologist

## 2022-04-06 NOTE — Patient Instructions (Signed)
Diverticulitis Diverticulitis  La diverticulitis ocurre cuando pequeas bolsas que se han formado en el colon (intestino grueso) se infectan o se inflaman. Esto ocasiona dolor en el vientre (abdomen) y heces acuosas (diarrea). Estas bolsas en el colon se denominan divertculos. Las bolsas se forman en las personas que tienen una afeccin llamada diverticulosis. Cules son las causas? Esta afeccin puede ser causada por materia fecal (heces) que queda atrapada en las bolsas del colon. La materia fecal permite que los microbios (bacterias) crezcan en las bolsas. Esto causa la infeccin. Qu incrementa el riesgo? Es ms probable que contraiga esta afeccin si tiene pequeas bolsas en el colon. El riesgo es mayor si: Tiene sobrepeso o mucho sobrepeso (es obeso). No realiza actividad fsica suficiente. Bebe alcohol. Fuma o consume productos que contienen tabaco. Lleva una dieta con gran cantidad de carnes rojas, como carne de vaca, cerdo o cordero. Lleva una dieta que no incluye suficiente cantidad [place]. Es mayor de 3 aos. Cules son los signos o sntomas? Dolor en el abdomen. El dolor suele aparecer en el lado izquierdo, pero puede manifestarse en otras zonas. Cristy Hilts y sensacin de fro. Ganas de vomitar. Vmitos. Clicos. Sensacin de estar lleno. Cambios en la frecuencia de las deposiciones. Sangre en las heces. Cmo se trata? La [employer] de los casos se tratan en casa con las siguientes medidas: Tomar analgsicos de Radio broadcast assistant. Seguir una dieta lquida absoluta. Tomar antibiticos. Hacer reposo. Es posible que los casos muy graves deban tratarse en el hospital. Esto puede incluir: No comer ni beber nada. Tomar analgsicos recetados. Recibir antibiticos por un tubo (catter) intravenoso. Recibir lquidos y alimentos a travs de un tubo intravenoso. Someterse a [place]. Cuando se sienta mejor, el mdico puede indicarle que se someta a un estudio para Landscape architect colon  (colonoscopa). Siga estas instrucciones en su casa: Medicamentos Use los medicamentos de venta libre y los recetados solamente como se lo haya indicado el mdico. Estos incluyen lo siguiente: Antibiticos. Analgsicos. Pastillas de [place]. Probiticos. Laxantes. Si le recetaron un antibitico, tmelo como se lo haya indicado el mdico. No deje de tomar el antibitico aunque comience a sentirse mejor. Pregntele al mdico si el medicamento recetado le impide conducir o usar [place]. Comida y bebida  Siga una dieta como se lo haya indicado el mdico. Cuando se sienta mejor, el mdico puede indicarle que cambie la dieta. Tal vez necesite ingerir gran cantidad de fibra. La fibra facilita la evacuacin intestinal (las deposiciones). Los alimentos con fibra [employer] siguientes: [place]. Frijoles. Lentejas. Verduras de [employer]. Evite comer carne roja. Instrucciones generales No consuma ningn producto que contenga nicotina o tabaco, como cigarrillos, cigarrillos electrnicos y tabaco de Higher education careers adviser. Si necesita ayuda para dejar de consumir estos productos, consulte al [employer]. Haga ejercicio 3 o ms veces por semana. Trate de hacer 30 minutos cada vez. Ejerctese lo suficiente como para transpirar y Conservation officer, historic buildings los latidos cardacos. Concurra a todas las visitas de seguimiento como se lo haya indicado el mdico. Esto es importante. Comunquese con un mdico si: El dolor no mejora. No defeca como lo hace normalmente. Solicite ayuda de inmediato si: El Holiday representative. Los sntomas no mejoran. Los sntomas empeoran con gran rapidez. Tiene fiebre. Vomita ms de una vez. Sus heces tienen las siguientes caractersticas: Teacher, English as a foreign language. Son de color negro. Son alquitranadas. Resumen Esta afeccin ocurre cuando pequeas bolsas que se forman en el colon se infectan o se inflaman. Tome los medicamentos solamente como se lo haya indicado el mdico. [place]  una dieta como se lo haya indicado el  mdico. Concurra a todas las visitas de seguimiento como se lo haya indicado el mdico. Esto es importante. Esta informacin no tiene Marine scientist el consejo del mdico. Asegrese de hacerle al mdico cualquier pregunta que tenga. Document Revised: 05/04/2019 Document Reviewed: 05/04/2019 Elsevier Patient Education  [place].

## 2022-04-08 NOTE — Progress Notes (Deleted)
    Jenny Wagner D.Jenny Wagner Phone: [phone]   Assessment and Plan:     There are no diagnoses linked to this encounter.  ***   Pertinent previous records reviewed include ***   Follow Up: ***     Subjective:   I, Jenny Wagner, am serving as a Education administrator for Doctor Glennon Mac  Chief Complaint: low back pain   HPI:   04/09/2022 Patient is a 41 year old female complaining of low back pain. Patient states  Relevant Historical Information: ***  Additional pertinent review of systems negative.   Current Outpatient Medications:    amoxicillin-clavulanate (AUGMENTIN) 875-125 MG tablet, Take 1 tablet by mouth every 12 (twelve) hours., Disp: 14 tablet, Rfl: 0   ferrous sulfate 325 (65 FE) MG tablet, Take 1 tablet (325 mg total) by mouth daily with breakfast., Disp: 45 tablet, Rfl: 0   Multiple Vitamin (MULTI-VITAMIN) tablet, Take 1 tablet by mouth daily., Disp: , Rfl:    ondansetron (ZOFRAN) 4 MG tablet, Take 1 tablet (4 mg total) by mouth every 6 (six) hours., Disp: 12 tablet, Rfl: 0   tranexamic acid (LYSTEDA) 650 MG TABS tablet, Take 2 tablets (1,300 mg total) by mouth 3 (three) times daily. Take during menses for a maximum of five days, Disp: 30 tablet, Rfl: 2   valsartan-hydrochlorothiazide (DIOVAN-HCT) 80-12.5 MG tablet, Take 1 tablet by mouth daily., Disp: 90 tablet, Rfl: 3   Objective:     There were no vitals filed for this visit.    There is no height or weight on file to calculate BMI.    Physical Exam:    ***   Electronically signed by:  Jenny Wagner D.[name] 8:34 AM 04/08/22

## 2022-04-09 ENCOUNTER — Ambulatory Visit: Payer: Medicaid Other | Admitting: Sports Medicine

## 2022-05-07 ENCOUNTER — Encounter: Payer: Self-pay | Admitting: Nurse Practitioner

## 2022-05-27 ENCOUNTER — Encounter: Payer: Self-pay | Admitting: Nurse Practitioner

## 2022-05-27 ENCOUNTER — Ambulatory Visit (INDEPENDENT_AMBULATORY_CARE_PROVIDER_SITE_OTHER): Payer: Self-pay | Admitting: Nurse Practitioner

## 2022-05-27 VITALS — BP 132/80 | HR 65 | Ht 61.0 in | Wt 152.0 lb

## 2022-05-27 DIAGNOSIS — K5792 Diverticulitis of intestine, part unspecified, without perforation or abscess without bleeding: Secondary | ICD-10-CM

## 2022-05-27 MED ORDER — NA SULFATE-K SULFATE-MG SULF 17.5-3.13-1.6 GM/177ML PO SOLN: 1.0000 | Freq: Once | ORAL | 0 refills | Status: AC

## 2022-05-27 NOTE — Progress Notes (Signed)
I agree with the assessment and plan as outlined by Ms. Guenther. 

## 2022-05-27 NOTE — Patient Instructions (Addendum)
  Hold Iron for 7 days prior to procedure.  You have been scheduled for a colonoscopy. Please follow written instructions given to you at your visit today.  Please pick up your prep supplies at the pharmacy within the next 1-3 days. If you use inhalers (even only as needed), please bring them with you on the day of your procedure.    _______________________________________________________  If your blood pressure at your visit was 140/90 or greater, please contact your primary care physician to follow up on this.  _______________________________________________________  If you are age 19 or younger, your body mass index should be between 19-25. Your Body mass index is 28.72 kg/m. If this is out of the aformentioned range listed, please consider follow up with your Primary Care Provider.   __________________________________________________________  [facility] providers would like to encourage you to use [facility] to communicate with providers for non-urgent requests or questions.  Due to long hold times on the telephone, sending your provider a message by [facility] may be a faster and more efficient way to get a response.  Please allow 48 business hours for a response.  Please remember that this is for non-urgent requests.   Due to recent changes in healthcare laws, you may see the results of your imaging and laboratory studies on MyChart before your provider has had a chance to review them.  We understand that in some cases there may be results that are confusing or concerning to you. Not all laboratory results come back in the same time frame and the provider may be waiting for multiple results in order to interpret others.  Please give [place] 48 hours in order for your provider to thoroughly review all the results before contacting the office for clarification of your results.    Thank you for choosing me and [facility].  Tye Savoy, NP.
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
## 2022-06-12 ENCOUNTER — Encounter: Payer: Self-pay | Admitting: Internal Medicine

## 2022-06-12 IMAGING — DX DG KNEE AP/LAT W/ SUNRISE*L*
3 series · 3 of 3 positions shown · non-contrast
Comparison: None.

CLINICAL DATA: Left knee pain

EXAM:
LEFT KNEE 3 VIEWS

[knee ap]
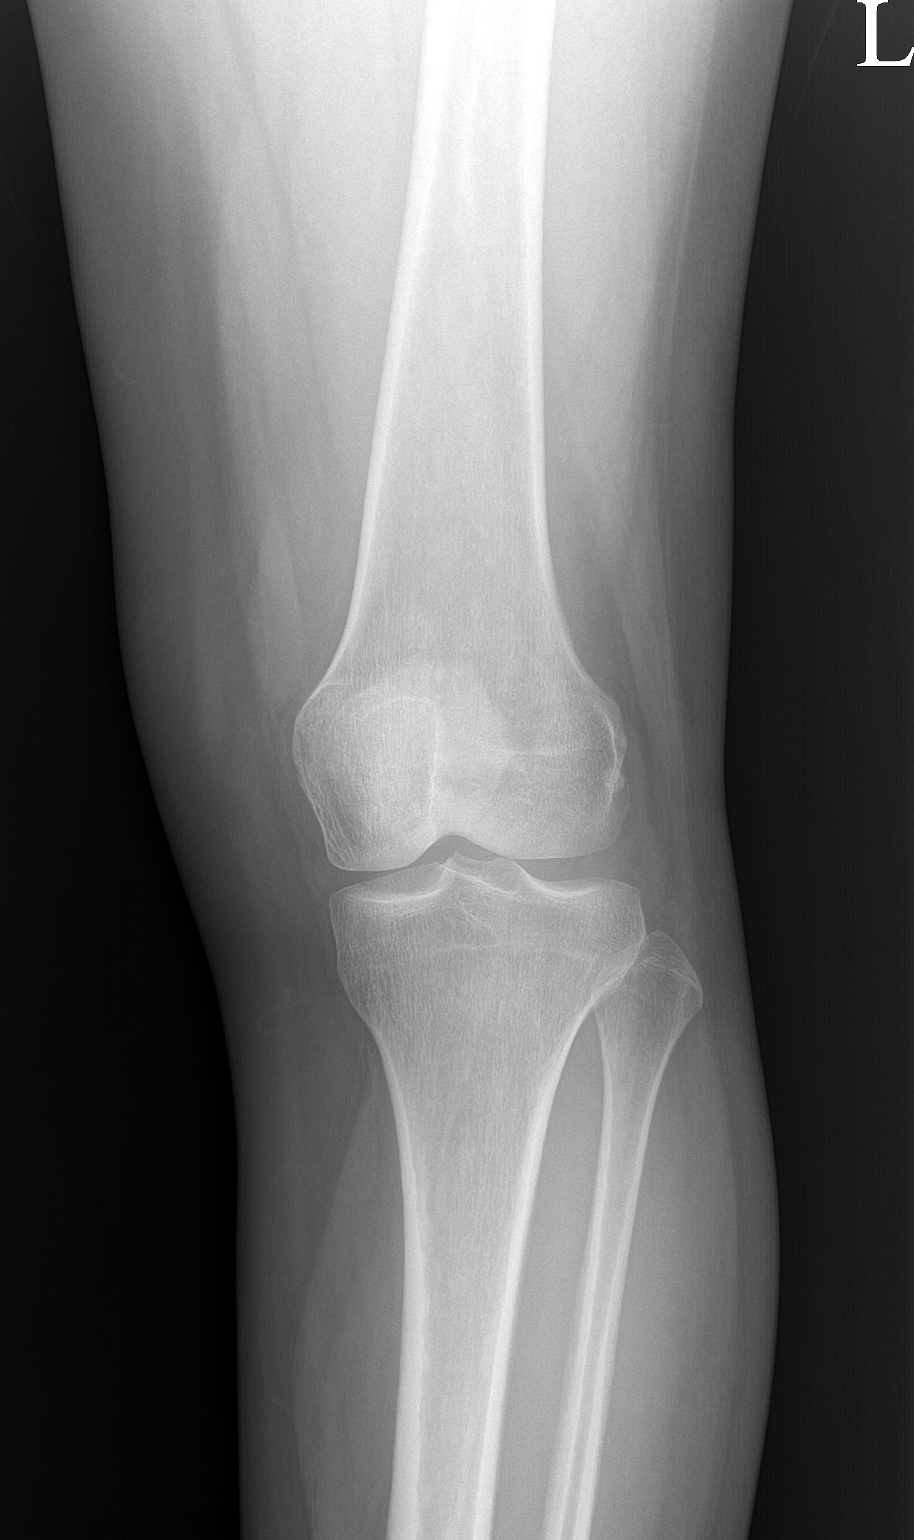

[knee lat]
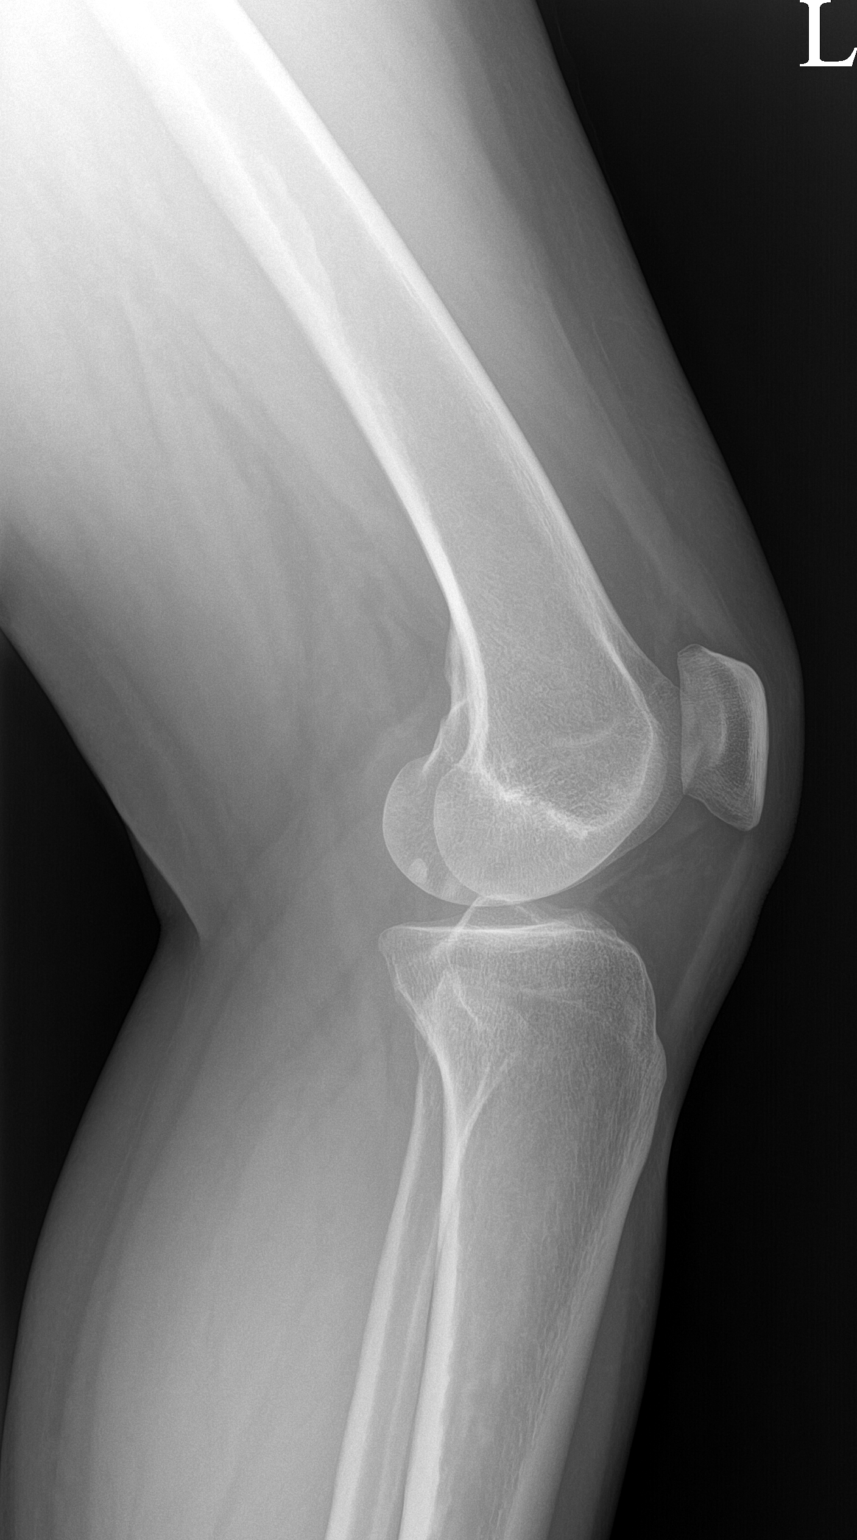

[patella]
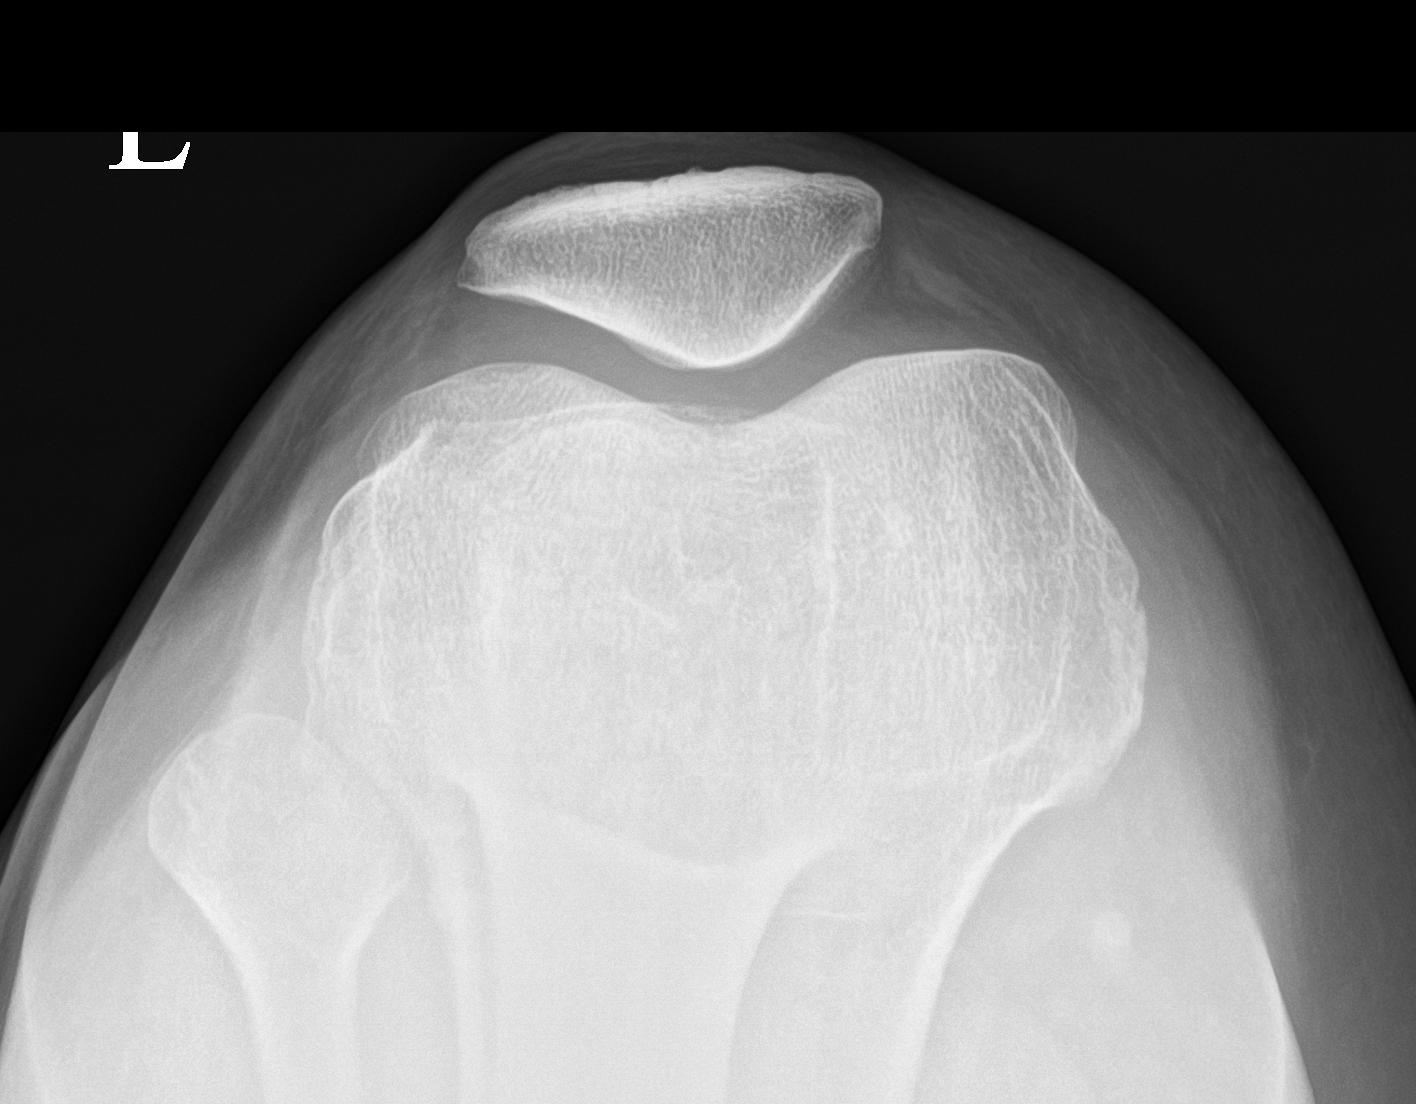

[3 of 3 positions shown; findings below may reference images not displayed]

FINDINGS: No evidence of fracture, dislocation, or joint effusion. No evidence
of arthropathy or other focal bone abnormality. Soft tissues are
unremarkable.
IMPRESSION: Negative.

## 2022-06-26 ENCOUNTER — Telehealth: Payer: Self-pay | Admitting: Emergency Medicine

## 2022-06-26 ENCOUNTER — Ambulatory Visit: Payer: Self-pay | Admitting: Nurse Practitioner

## 2022-06-26 NOTE — Telephone Encounter (Signed)
Pt was a no show for an OV with Lauren on 06/26/22, I sent a letter./

## 2022-07-13 NOTE — Telephone Encounter (Signed)
no show, pt at [facility], fee generated & letter sent

## 2022-07-13 NOTE — Telephone Encounter (Signed)
Noted  

## 2022-07-16 ENCOUNTER — Encounter: Payer: Self-pay | Admitting: Emergency Medicine

## 2022-07-16 ENCOUNTER — Ambulatory Visit (INDEPENDENT_AMBULATORY_CARE_PROVIDER_SITE_OTHER): Payer: Self-pay | Admitting: Emergency Medicine

## 2022-07-16 VITALS — BP 118/70 | HR 69 | Temp 98.2°F | Ht 61.0 in | Wt 151.1 lb

## 2022-07-16 DIAGNOSIS — N920 Excessive and frequent menstruation with regular cycle: Secondary | ICD-10-CM

## 2022-07-16 DIAGNOSIS — I10 Essential (primary) hypertension: Secondary | ICD-10-CM

## 2022-07-16 DIAGNOSIS — L989 Disorder of the skin and subcutaneous tissue, unspecified: Secondary | ICD-10-CM

## 2022-07-16 NOTE — Progress Notes (Signed)
[place] 42 y.o.   Chief Complaint  Patient presents with   Medical Management of Chronic Issues    F/u appt, moles on body, concern about her CT results     HISTORY OF PRESENT ILLNESS: This is a 42 y.o. female A1A here for follow-up of chronic medical conditions History of diverticulitis in the recent past Seen by gastrointestinal doctor last month.  Scheduled for colonoscopy later this month. Concerned about several skin lesions.  Needs dermatology referral BP Readings from Last 3 Encounters:  07/16/22 118/70  05/27/22 132/80  04/06/22 122/82     HPI   Prior to Admission medications   Medication Sig Start Date End Date Taking? Authorizing Provider  ferrous sulfate 325 (65 FE) MG tablet Take 1 tablet (325 mg total) by mouth daily with breakfast. 03/25/21  Yes Chancy Milroy, MD  valsartan-hydrochlorothiazide (DIOVAN-HCT) 80-12.5 MG tablet Take 1 tablet by mouth daily. 01/21/22  Yes Horald Pollen, MD    No Known Allergies  Patient Active Problem List   Diagnosis Date Noted   Essential hypertension 01/21/2022   Chronic tension-type headache, not intractable 01/21/2022   Fibroid 04/10/2021   Menorrhagia with regular cycle 03/19/2021   Mild reactive airways disease 06/04/2017    Past Medical History:  Diagnosis Date   Depression    Hypertension     Past Surgical History:  Procedure Laterality Date   CHOLECYSTECTOMY  2007    Social History   Socioeconomic History   Marital status: Married    Spouse name: Not on file   Number of children: 2   Years of education: Not on file   Highest education level: Not on file  Occupational History   Occupation: Stay at home mom  Tobacco Use   Smoking status: Never   Smokeless tobacco: Never  Vaping Use   Vaping Use: Never used  Substance and Sexual Activity   Alcohol use: No   Drug use: No   Sexual activity: Yes    Birth control/protection: None  Other Topics Concern   Not on file  Social  History Narrative   Not on file   Social Determinants of Health   Financial Resource Strain: Not on file  Food Insecurity: Not on file  Transportation Needs: Not on file  Physical Activity: Not on file  Stress: Not on file  Social Connections: Not on file  Intimate Partner Violence: Not on file    Family History  Problem Relation Age of Onset   Pancreatic cancer Mother    Hypertension Other    Diabetes Other    Colon cancer Neg Hx    Esophageal cancer Neg Hx    Liver cancer Neg Hx    Rectal cancer Neg Hx    Stomach cancer Neg Hx      Review of Systems  Constitutional: Negative.  Negative for chills and fever.  HENT: Negative.  Negative for congestion and sore throat.   Respiratory: Negative.  Negative for cough and shortness of breath.   Cardiovascular: Negative.  Negative for chest pain and palpitations.  Gastrointestinal:  Negative for abdominal pain, diarrhea, nausea and vomiting.  Genitourinary: Negative.  Negative for dysuria and hematuria.  Skin: Negative.  Negative for rash.  Neurological: Negative.  Negative for dizziness and headaches.  All other systems reviewed and are negative.   Vitals:   07/16/22 1338  BP: 118/70  Pulse: 69  Temp: 98.2 F (36.8 C)  SpO2: 99%    Physical Exam Vitals reviewed.  Constitutional:      Appearance: Normal appearance.  HENT:     Head: Normocephalic.     Mouth/Throat:     Mouth: Mucous membranes are moist.     Pharynx: Oropharynx is clear.  Eyes:     Extraocular Movements: Extraocular movements intact.     Pupils: Pupils are equal, round, and reactive to light.  Cardiovascular:     Rate and Rhythm: Normal rate and regular rhythm.     Pulses: Normal pulses.     Heart sounds: Normal heart sounds.  Pulmonary:     Effort: Pulmonary effort is normal.     Breath sounds: Normal breath sounds.  Abdominal:     Palpations: Abdomen is soft.     Tenderness: There is no abdominal tenderness.  Musculoskeletal:         General: Normal range of motion.     Cervical back: No tenderness.  Lymphadenopathy:     Cervical: No cervical adenopathy.  Skin:    General: Skin is warm and dry.     Capillary Refill: Capillary refill takes less than 2 seconds.     Findings: Lesion present.     Comments: Several small hyperpigmented lesions in the neck and upper body  Neurological:     General: No focal deficit present.     Mental Status: She is alert and oriented to person, place, and time.  Psychiatric:        Mood and Affect: Mood normal.        Behavior: Behavior normal.      ASSESSMENT & PLAN: A total of 42 minutes was spent with the patient and counseling/coordination of care regarding preparing for this visit, review of most recent office visit notes, review of most recent blood work results, review of all medications, review of chronic medical conditions, cardiovascular risks associated with hypertension, education on nutrition, prognosis, documentation and need for follow-up . Problem List Items Addressed This Visit       Cardiovascular and Mediastinum   Essential hypertension    Well-controlled hypertension. Continue Diovan HCTZ 80-12.5 mg daily Cardiovascular risk associated with hypertension discussed Dietary approaches to stop hypertension discussed        Other   Menorrhagia with regular cycle    Secondary to uterine fibroid. Was recommended to have a hysterectomy Follows up with gynecologist on a regular basis      Other Visit Diagnoses     Skin lesions    -  Primary   Relevant Orders   Ambulatory referral to Dermatology      Patient Instructions  Mantenimiento de la salud en [place] Maintenance, Female Adoptar un estilo de vida saludable y recibir atencin preventiva son importantes para promover la salud y Musician. Consulte al mdico sobre: El esquema adecuado para hacerse pruebas y exmenes peridicos. Cosas que puede hacer por su cuenta para prevenir  enfermedades y [place] sano. Qu debo saber sobre la dieta, el peso y el ejercicio? Consuma una dieta saludable  Consuma una dieta que incluya muchas verduras, frutas, productos lcteos con bajo contenido de [place] y Advertising account planner. No consuma muchos alimentos ricos en grasas slidas, azcares agregados o sodio. Mantenga un peso saludable El ndice de masa muscular [facility]) se [place] the [place] para identificar problemas de [place]. Proporciona una estimacin de la grasa corporal basndose en el peso y la altura. Su mdico puede ayudarle a Radiation protection practitioner [place] y a Scientist, forensic o Theatre manager un peso saludable. Haga ejercicio con regularidad Haga ejercicio con regularidad.  Esta es una de las prcticas ms importantes que puede hacer por su salud. La [employer] de los adultos deben seguir estas pautas: Optometrist, al menos, 150 minutos de actividad fsica por semana. El ejercicio debe aumentar la frecuencia cardaca y Nature conservation officer transpirar (ejercicio de intensidad moderada). Hacer ejercicios de fortalecimiento por lo [employer] por semana. Agregue esto a su plan de ejercicio de intensidad moderada. Pase menos tiempo sentada. Incluso la actividad fsica ligera puede ser beneficiosa. Controle sus niveles de colesterol y lpidos en la sangre Comience a realizarse anlisis de lpidos y Research officer, trade union en la sangre a los 80 aos y luego reptalos cada 5 aos. Hgase controlar los niveles de colesterol con mayor frecuencia si: Sus niveles de lpidos y colesterol son altos. Es mayor de 30 aos. Presenta un alto riesgo de padecer enfermedades cardacas. Qu debo saber sobre las pruebas de deteccin del cncer? Segn su historia clnica y sus antecedentes familiares, es posible que deba realizarse pruebas de deteccin del cncer en diferentes edades. Esto puede incluir pruebas de deteccin de lo siguiente: Cncer de mama. Cncer de cuello uterino. Cncer colorrectal. Cncer de piel. Cncer de pulmn. Qu debo saber sobre la  enfermedad cardaca, la diabetes y la hipertensin arterial? Presin arterial y enfermedad cardaca La hipertensin arterial causa enfermedades cardacas y [place] el riesgo de accidente cerebrovascular. Es ms probable que esto se manifieste en las personas que tienen lecturas de presin arterial alta o tienen sobrepeso. Hgase controlar la presin arterial: Cada 3 a 5 aos si tiene entre 18 y 8 aos. Todos los aos si es mayor de 40 aos. Diabetes Realcese exmenes de deteccin de la diabetes con regularidad. Este anlisis revisa el nivel de azcar en la sangre en [place]. Hgase las pruebas de deteccin: Cada tres aos despus de los 22 aos de edad si tiene un peso normal y un bajo riesgo de padecer diabetes. Con ms frecuencia y a partir de [place] edad inferior si tiene sobrepeso o un alto riesgo de padecer diabetes. Qu debo saber sobre la prevencin de infecciones? Hepatitis B Si tiene un riesgo ms alto de contraer hepatitis B, debe someterse a un examen de deteccin de este virus. Hable con el mdico para averiguar si tiene riesgo de contraer la infeccin por hepatitis B. Hepatitis C Se recomienda el anlisis a: [employer] 1945 y 1965. Todas las personas que tengan un riesgo de haber contrado hepatitis C. Enfermedades de transmisin sexual (ETS) Hgase las pruebas de Programme researcher, broadcasting/film/video de ITS, incluidas la gonorrea y la clamidia, si: Es sexualmente activa y es menor de 74 aos. Es mayor de 87 aos, y Investment banker, operational informa que corre riesgo de tener este tipo de infecciones. La actividad sexual ha cambiado desde que le hicieron la ltima prueba de deteccin y tiene un riesgo mayor de Best boy clamidia o Radio broadcast assistant. Pregntele al mdico si usted tiene riesgo. Pregntele al mdico si usted tiene un alto riesgo de Museum/gallery curator VIH. El mdico tambin puede recomendarle un medicamento recetado para ayudar a evitar la infeccin por el VIH. Si elige tomar medicamentos para prevenir el VIH,  primero debe [employer] de deteccin del VIH. Luego debe hacerse anlisis cada 3 meses mientras est tomando los medicamentos. Embarazo Si est por dejar de Librarian, academic (fase premenopusica) y usted puede quedar [place], busque asesoramiento antes de [place]. Tome de 400 a 800 microgramos (mcg) de cido [employer] si [place]. Pida mtodos de control de la natalidad (anticonceptivos) si desea  evitar un embarazo no deseado. Osteoporosis y [place] La osteoporosis es una enfermedad en la que los huesos pierden los minerales y la fuerza por el avance de la edad. El resultado pueden ser fracturas en los [place]. Si tiene 25 aos o ms, o si est en riesgo de sufrir osteoporosis y fracturas, pregunte a su mdico si debe: Hacerse pruebas de deteccin de prdida sea. Tomar un suplemento de calcio o de vitamina D para reducir el riesgo de fracturas. Recibir terapia de reemplazo hormonal (TRH) para tratar los sntomas de la menopausia. Siga estas indicaciones en su casa: Consumo de alcohol No beba alcohol si: Su mdico le indica no hacerlo. Est embarazada, puede estar embarazada o est tratando de [place]. Si bebe alcohol: Limite la cantidad que bebe a lo siguiente: De 0 a 1 bebida por da. Sepa cunta cantidad de alcohol hay en las bebidas que toma. En los [place], una medida equivale a una botella de cerveza de 12 oz (355 ml), un vaso de vino de 5 oz (148 ml) o un vaso de una bebida alcohlica de alta graduacin de 1 oz (44 ml). Estilo de vida No consuma ningn producto que contenga nicotina o tabaco. Estos productos incluyen cigarrillos, tabaco para Higher education careers adviser y aparatos de vapeo, como los Psychologist, sport and exercise. Si necesita ayuda para dejar de consumir estos productos, consulte al mdico. No consuma drogas. No comparta agujas. Solicite ayuda a su mdico si necesita apoyo o informacin para abandonar las drogas. Indicaciones  generales Realcese los estudios de rutina de la salud, dentales y de Public librarian. [place]. Infrmele a su mdico si: Se siente deprimida con frecuencia. Alguna vez ha sido vctima de [place] o no se siente seguro en su casa. Resumen Adoptar un estilo de vida saludable y recibir atencin preventiva son importantes para promover la salud y Musician. Siga las instrucciones del mdico acerca de una dieta saludable, el ejercicio y la realizacin de pruebas o exmenes para Engineer, building services. Siga las instrucciones del mdico con respecto al control del colesterol y la presin arterial. Esta informacin no tiene Marine scientist el consejo del mdico. Asegrese de hacerle al mdico cualquier pregunta que tenga. Document Revised: 09/26/2020 Document Reviewed: 09/26/2020 Elsevier Patient Education  [place], MD [facility]

## 2022-07-16 NOTE — Patient Instructions (Signed)

## 2022-07-16 NOTE — Assessment & Plan Note (Signed)
Well-controlled hypertension. Continue Diovan HCTZ 80-12.5 mg daily Cardiovascular risk associated with hypertension discussed Dietary approaches to stop hypertension discussed

## 2022-07-16 NOTE — Assessment & Plan Note (Signed)
Secondary to uterine fibroid. Was recommended to have a hysterectomy Follows up with gynecologist on a regular basis

## 2022-07-29 ENCOUNTER — Ambulatory Visit (AMBULATORY_SURGERY_CENTER): Payer: Self-pay | Admitting: Internal Medicine

## 2022-07-29 ENCOUNTER — Encounter: Payer: Self-pay | Admitting: Internal Medicine

## 2022-07-29 VITALS — BP 104/62 | HR 77 | Temp 97.8°F | Resp 22 | Ht 61.0 in | Wt 152.0 lb

## 2022-07-29 DIAGNOSIS — K5792 Diverticulitis of intestine, part unspecified, without perforation or abscess without bleeding: Secondary | ICD-10-CM

## 2022-07-29 MED ORDER — SODIUM CHLORIDE 0.9 % IV SOLN: 500.0000 mL | Freq: Once | INTRAVENOUS | Status: DC

## 2022-07-29 NOTE — Progress Notes (Signed)
Report to PACU, RN, vss, BBS= Clear.  

## 2022-07-29 NOTE — Progress Notes (Signed)
GASTROENTEROLOGY PROCEDURE H&P NOTE   Primary Care Physician: Horald Pollen, MD    Reason for Procedure:   History of diverticulitis  Plan:    Colonoscopy  Patient is appropriate for endoscopic procedure(s) in the ambulatory ([place]) setting.  The nature of the procedure, as well as the risks, benefits, and alternatives were carefully and thoroughly reviewed with the patient. Ample time for discussion and questions allowed. The patient understood, was satisfied, and agreed to proceed.     HPI: Jenny Wagner is a 42 y.o. female who presents for colonoscopy for evaluation of history of diverticulitis .  Patient was most recently seen in the Gastroenterology Clinic on 05/27/22.  No interval change in medical history since that appointment. Please refer to that note for full details regarding GI history and clinical presentation.   Past Medical History:  Diagnosis Date   Depression    Hypertension     Past Surgical History:  Procedure Laterality Date   CHOLECYSTECTOMY  2007    Prior to Admission medications   Medication Sig Start Date End Date Taking? Authorizing Provider  valsartan-hydrochlorothiazide (DIOVAN-HCT) 80-12.5 MG tablet Take 1 tablet by mouth daily. 01/21/22  Yes Horald Pollen, MD  ferrous sulfate 325 (65 FE) MG tablet Take 1 tablet (325 mg total) by mouth daily with breakfast. 03/25/21   Chancy Milroy, MD    Current Outpatient Medications  Medication Sig Dispense Refill   valsartan-hydrochlorothiazide (DIOVAN-HCT) 80-12.5 MG tablet Take 1 tablet by mouth daily. 90 tablet 3   ferrous sulfate 325 (65 FE) MG tablet Take 1 tablet (325 mg total) by mouth daily with breakfast. 45 tablet 0   Current Facility-Administered Medications  Medication Dose Route Frequency Provider Last Rate Last Admin   0.9 %  sodium chloride infusion  500 mL Intravenous Once Sharyn Creamer, MD        Allergies as of 07/29/2022   (No Known Allergies)    Family  History  Problem Relation Age of Onset   Pancreatic cancer Mother    Hypertension Other    Diabetes Other    Colon cancer Neg Hx    Esophageal cancer Neg Hx    Liver cancer Neg Hx    Rectal cancer Neg Hx    Stomach cancer Neg Hx     Social History   Socioeconomic History   Marital status: Married    Spouse name: Not on file   Number of children: 2   Years of education: Not on file   Highest education level: Not on file  Occupational History   Occupation: Stay at home mom  Tobacco Use   Smoking status: Never   Smokeless tobacco: Never  Vaping Use   Vaping Use: Never used  Substance and Sexual Activity   Alcohol use: No   Drug use: No   Sexual activity: Yes    Birth control/protection: None  Other Topics Concern   Not on file  Social History Narrative   Not on file   Social Determinants of Health   Financial Resource Strain: Not on file  Food Insecurity: Not on file  Transportation Needs: Not on file  Physical Activity: Not on file  Stress: Not on file  Social Connections: Not on file  Intimate Partner Violence: Not on file    Physical Exam: Vital signs in last 24 hours: BP 119/77   Pulse 82   Temp 97.8 F (36.6 C)   Ht 5\' 1"  (1.549 m)   Wt 152  lb (68.9 kg)   LMP 07/17/2022   SpO2 99%   BMI 28.72 kg/m  GEN: NAD EYE: Sclerae anicteric ENT: MMM CV: Non-tachycardic Pulm: No increased WOB GI: Soft NEURO:  Alert & Oriented   Christia Reading, MD [facility]   07/29/2022 10:48 AM

## 2022-07-29 NOTE — Op Note (Signed)
[place] Patient Name: Jenny Wagner Procedure Date: 07/29/2022 10:50 AM MRN: [id number] Endoscopist: Georgian Co , , [id number] Age: 42 Referring MD:  Date of Birth: 06/24/1980 Gender: Female Account #: [id number] Procedure:                Colonoscopy Indications:              Follow-up of diverticulitis Medicines:                Monitored Anesthesia Care Procedure:                Pre-Anesthesia Assessment:                           - Prior to the procedure, a History and Physical                            was performed, and patient medications and                            allergies were reviewed. The patient's tolerance of                            previous anesthesia was also reviewed. The risks                            and benefits of the procedure and the sedation                            options and risks were discussed with the patient.                            All questions were answered, and informed consent                            was obtained. Prior Anticoagulants: The patient has                            taken no anticoagulant or antiplatelet agents. ASA                            Grade Assessment: II - A patient with mild systemic                            disease. After reviewing the risks and benefits,                            the patient was deemed in satisfactory condition to                            undergo the procedure.                           After obtaining informed consent, the colonoscope  was passed under direct vision. Throughout the                            procedure, the patient's blood pressure, pulse, and                            oxygen saturations were monitored continuously. The                            Olympus CF-[id number] SN [id number] was introduced through                            the anus and advanced to the the cecum, identified                            by appendiceal  orifice and ileocecal valve. The                            colonoscopy was performed without difficulty. The                            patient tolerated the procedure well. The quality                            of the bowel preparation was excellent. The                            ileocecal valve, appendiceal orifice, and rectum                            were photographed. Scope In: 10:59:29 AM Scope Out: 11:11:26 AM Scope Withdrawal Time: 0 hours 6 minutes 45 seconds  Total Procedure Duration: 0 hours 11 minutes 57 seconds  Findings:                 Multiple diverticula were found in the transverse                            colon and ascending colon.                           Non-bleeding internal hemorrhoids were found during                            retroflexion. Complications:            No immediate complications. Estimated Blood Loss:     Estimated blood loss was minimal. Impression:               - Diverticulosis in the transverse colon and in the                            ascending colon.                           - Non-bleeding internal hemorrhoids.                           -  No specimens collected. Recommendation:           - Discharge patient to home (with escort).                           - Repeat colonoscopy in 10 years for screening                            purposes.                           - The findings and recommendations were discussed                            with the patient. Dr Georgian Co "Lyndee Leo" Lorenso Courier,  07/29/2022 11:13:40 AM

## 2022-07-29 NOTE — Progress Notes (Signed)
Pt's states no medical or surgical changes since previsit or office visit. 

## 2022-07-29 NOTE — Patient Instructions (Signed)
Resume previous diet and medications. Repeat Colonoscopy in 10 years for screening purposes. Handouts provided on Hemorrhoids and Diverticulosis  YOU HAD AN ENDOSCOPIC PROCEDURE TODAY AT [facility]:   Refer to the procedure report that was given to you for any specific questions about what was found during the examination.  If the procedure report does not answer your questions, please call your gastroenterologist to clarify.  If you requested that your care partner not be given the details of your procedure findings, then the procedure report has been included in a sealed envelope for you to review at your convenience later.  YOU SHOULD EXPECT: Some feelings of bloating in the abdomen. Passage of more gas than usual.  Walking can help get rid of the air that was put into your GI tract during the procedure and reduce the bloating. If you had a lower endoscopy (such as a colonoscopy or flexible sigmoidoscopy) you may notice spotting of blood in your stool or on the toilet paper. If you underwent a bowel prep for your procedure, you may not have a normal bowel movement for a few days.  Please Note:  You might notice some irritation and congestion in your nose or some drainage.  This is from the oxygen used during your procedure.  There is no need for concern and it should clear up in a day or so.  SYMPTOMS TO REPORT IMMEDIATELY:  Following lower endoscopy (colonoscopy or flexible sigmoidoscopy):  Excessive amounts of blood in the stool  Significant tenderness or worsening of abdominal pains  Swelling of the abdomen that is new, acute  Fever of 100F or higher   For urgent or emergent issues, a gastroenterologist can be reached at any hour by calling [phone]. Do not use MyChart messaging for urgent concerns.    DIET:  We do recommend a small meal at first, but then you may proceed to your regular diet.  Drink plenty of fluids but you should avoid alcoholic beverages for 24  hours.  ACTIVITY:  You should plan to take it easy for the rest of today and you should NOT DRIVE or use heavy machinery until tomorrow (because of the sedation medicines used during the test).    FOLLOW UP: Our staff will call the number listed on your records the next business day following your procedure.  We will call around 7:15- 8:00 am to check on you and address any questions or concerns that you may have regarding the information given to you following your procedure. If we do not reach you, we will leave a message.     If any biopsies were taken you will be contacted by phone or by letter within the next 1-3 weeks.  Please call us at [phone] if you have not heard about the biopsies in 3 weeks.    SIGNATURES/CONFIDENTIALITY: You and/or your care partner have signed paperwork which will be entered into your electronic medical record.  These signatures attest to the fact that that the information above on your After Visit Summary has been reviewed and is understood.  Full responsibility of the confidentiality of this discharge information lies with you and/or your care-partner.

## 2022-07-30 ENCOUNTER — Telehealth: Payer: Self-pay

## 2022-07-30 NOTE — Telephone Encounter (Signed)
Follow up call to pt, lm for pt to call if having any difficulty with normal activities or eating and drinking.  Also to call if any other questions or concerns.  

## 2022-10-06 ENCOUNTER — Ambulatory Visit: Payer: Medicaid Other | Admitting: Emergency Medicine

## 2022-11-24 ENCOUNTER — Ambulatory Visit (INDEPENDENT_AMBULATORY_CARE_PROVIDER_SITE_OTHER): Payer: Self-pay | Admitting: Family Medicine

## 2022-11-24 ENCOUNTER — Encounter: Payer: Self-pay | Admitting: Family Medicine

## 2022-11-24 VITALS — BP 120/76 | HR 94 | Temp 98.9°F | Ht 61.0 in | Wt 154.8 lb

## 2022-11-24 DIAGNOSIS — J029 Acute pharyngitis, unspecified: Secondary | ICD-10-CM

## 2022-11-24 DIAGNOSIS — J069 Acute upper respiratory infection, unspecified: Secondary | ICD-10-CM

## 2022-11-24 DIAGNOSIS — R059 Cough, unspecified: Secondary | ICD-10-CM

## 2022-11-24 LAB — POC COVID19 BINAXNOW: SARS Coronavirus 2 Ag: NEGATIVE

## 2022-11-24 LAB — POCT RAPID STREP A (OFFICE): Rapid Strep A Screen: NEGATIVE

## 2022-11-24 NOTE — Progress Notes (Signed)
Established Patient Office Visit  Subjective   Patient ID: Jenny Wagner, female    DOB: [birth year]  Age: 42 y.o. MRN: [id number]  No chief complaint on file.   HPI   Mrs Iwai is seen in 1 day history of sore throat, cough, nasal congestion.  She has also had some right ear pain.  No known sick contacts.  Denies any nausea, vomiting, or diarrhea.  She was mostly concerned to rule out infection because of her 4 and 44 year old to live with her.  She denies any fever.  Mild body aches.  She is a non-smoker.  No chronic lung disease.  She has hypertension treated with Diovan HCTZ.  Past Medical History:  Diagnosis Date   Depression    Hypertension    Past Surgical History:  Procedure Laterality Date   CHOLECYSTECTOMY  2007    reports that she has never smoked. She has never used smokeless tobacco. She reports that she does not drink alcohol and does not use drugs. family history includes Diabetes in an other family member; Hypertension in an other family member; Pancreatic cancer in her mother. No Known Allergies  Review of Systems  Constitutional:  Positive for malaise/fatigue. Negative for chills and fever.  HENT:  Positive for congestion and sore throat.   Respiratory:  Positive for cough. Negative for shortness of breath and wheezing.   Cardiovascular:  Negative for chest pain.      Objective:     BP 120/76 (BP Location: Left Arm, Patient Position: Sitting, Cuff Size: Normal)   Pulse 94   Temp 98.9 F (37.2 C) (Oral)   Ht 5\' 1"  (1.549 m)   Wt 154 lb 12.8 oz (70.2 kg)   SpO2 98%   BMI 29.25 kg/m  BP Readings from Last 3 Encounters:  11/24/22 120/76  07/29/22 104/62  07/16/22 118/70   Wt Readings from Last 3 Encounters:  11/24/22 154 lb 12.8 oz (70.2 kg)  07/29/22 152 lb (68.9 kg)  07/16/22 151 lb 2 oz (68.5 kg)      Physical Exam Vitals reviewed.  Constitutional:      General: She is not in acute distress.    Appearance: Normal appearance. She is not  ill-appearing or toxic-appearing.  HENT:     Right Ear: Tympanic membrane normal.     Left Ear: Tympanic membrane normal.     Mouth/Throat:     Mouth: Mucous membranes are moist.     Pharynx: Oropharynx is clear. No oropharyngeal exudate.  Cardiovascular:     Rate and Rhythm: Normal rate and regular rhythm.  Pulmonary:     Effort: Pulmonary effort is normal.     Breath sounds: Normal breath sounds. No wheezing or rales.  Musculoskeletal:     Cervical back: Neck supple.  Lymphadenopathy:     Cervical: No cervical adenopathy.  Neurological:     Mental Status: She is alert.      Results for orders placed or performed in visit on 11/24/22  POC COVID-19  Result Value Ref Range   SARS Coronavirus 2 Ag Negative Negative  POC Rapid Strep A  Result Value Ref Range   Rapid Strep A Screen Negative Negative      The 10-year ASCVD risk score (Arnett DK, et al., 2019) is: 0.8%    Assessment & Plan:   Problem List Items Addressed This Visit   None Visit Diagnoses     Sore throat    -  Primary   Relevant Orders  POC COVID-19 (Completed)   POC Rapid Strep A (Completed)   Cough, unspecified type       Relevant Orders   POC COVID-19 (Completed)     Probable viral URI with cough.  Rapid strep negative.  COVID testing negative.  Patient did inquire regarding repeat COVID testing and we suggest that she might consider this in a day or 2.  Otherwise, treat symptomatically with plenty fluids and rest and over-the-counter ibuprofen, Tylenol, or Aleve  No follow-ups on file.    Evelena Peat, MD

## 2023-03-02 ENCOUNTER — Encounter: Payer: Self-pay | Admitting: Emergency Medicine

## 2023-03-02 ENCOUNTER — Ambulatory Visit (INDEPENDENT_AMBULATORY_CARE_PROVIDER_SITE_OTHER): Payer: Self-pay | Admitting: Emergency Medicine

## 2023-03-02 VITALS — BP 124/92 | HR 70 | Temp 98.3°F | Ht 61.0 in | Wt 158.5 lb

## 2023-03-02 DIAGNOSIS — K5730 Diverticulosis of large intestine without perforation or abscess without bleeding: Secondary | ICD-10-CM

## 2023-03-02 DIAGNOSIS — R1084 Generalized abdominal pain: Secondary | ICD-10-CM

## 2023-03-02 NOTE — Assessment & Plan Note (Signed)
Most recent colonoscopy report shows extensive diverticulosis of ascending and transverse colon.  Patient may be having transient episodes of diverticulitis accounting for some of her abdominal pain intermittent episodes.  Diet and nutrition discussed.  Treatment for mild diverticulitis discussed.

## 2023-03-02 NOTE — Assessment & Plan Note (Signed)
Clinically stable.  No red flag signs or symptoms. Benign physical examination. Afebrile with stable vital signs Differential diagnosis discussed with patient Advised to stay well-hydrated Diet and nutrition discussed ED precautions given

## 2023-03-02 NOTE — Patient Instructions (Signed)
Diverticulosis  Diverticulosis is when small pouches called diverticula form in the wall of the colon. The colon is where water is absorbed. It is also where poop (stool) is formed. The pouches form when the inside layer of the colon pushes through weak spots in the outer layers of the colon. You may have a few pouches or many of them. In most cases, the pouches do not cause problems. If they become inflamed or infected, you may have a condition called diverticulitis. What are the causes? The cause of this condition is not known. What increases the risk? You are more likely to get this condition if: You are older than 44 years of age. You do not eat enough fiber or you get constipated a lot. You are overweight. You do not get enough exercise. You smoke. You take over-the-counter pain medicines. You have a family history of the condition. What are the signs or symptoms? In most people, there are no symptoms. If you do have symptoms, they may include: Bloating. Stomach cramps. Constipation or diarrhea. Pain in the lower left side of your abdomen. How is this diagnosed? This condition is often diagnosed during an exam for other colon problems. It may be diagnosed when you have: A colonoscopy. This is when a tube with a camera on the end is used to look at your colon. A barium enema. This is an X-ray exam that uses dye to look at your colon. A CT scan. How is this treated? You may not need treatment. Your health care provider will tell you what you can do at home to help prevent problems. You may need treatment if you have symptoms or if you have had diverticulitis before. You may be told to: Eat a high-fiber diet. Take medicine to relax your colon. Lose weight. Follow these instructions at home: Medicines Take over-the-counter and prescription medicines only as told by your provider. If told, take a fiber supplement or probiotic. Managing constipation Your condition may cause  constipation. To prevent or treat constipation, you may need to: Drink enough fluid to keep your pee (urine) pale yellow. Take over-the-counter or prescription medicines. Eat foods that are high in fiber, such as beans, whole grains, and fresh fruits and vegetables. Limit foods that are high in fat and processed sugars, such as fried or sweet foods. Try not to strain when you poop. Contact a health care provider if: Your symptoms get worse all of a sudden. You have pain in your abdomen that gets worse. You have bloating or stomach cramps. You continue to have frequent constipation. You have a fever or chills. You vomit. Your poop is bloody, black, or tarry. This information is not intended to replace advice given to you by your health care provider. Make sure you discuss any questions you have with your health care provider. Document Revised: 01/15/2022 Document Reviewed: 01/15/2022 Elsevier Patient Education  2024 Elsevier Inc.  
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
## 2023-04-15 ENCOUNTER — Other Ambulatory Visit: Payer: Self-pay | Admitting: Emergency Medicine

## 2023-04-15 DIAGNOSIS — I10 Essential (primary) hypertension: Secondary | ICD-10-CM

## 2023-05-14 ENCOUNTER — Ambulatory Visit: Payer: Self-pay | Admitting: Emergency Medicine

## 2023-05-14 ENCOUNTER — Ambulatory Visit: Payer: Self-pay | Admitting: Primary Care
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
## 2023-05-17 ENCOUNTER — Encounter: Payer: Self-pay | Admitting: Internal Medicine

## 2023-05-17 ENCOUNTER — Ambulatory Visit: Payer: Self-pay | Admitting: Internal Medicine

## 2023-05-17 VITALS — BP 124/80 | HR 88 | Temp 98.3°F | Ht 61.0 in | Wt 159.0 lb

## 2023-05-17 DIAGNOSIS — R109 Unspecified abdominal pain: Secondary | ICD-10-CM | POA: Insufficient documentation

## 2023-05-17 DIAGNOSIS — N898 Other specified noninflammatory disorders of vagina: Secondary | ICD-10-CM

## 2023-05-17 DIAGNOSIS — J3489 Other specified disorders of nose and nasal sinuses: Secondary | ICD-10-CM | POA: Insufficient documentation

## 2023-05-17 DIAGNOSIS — R1011 Right upper quadrant pain: Secondary | ICD-10-CM | POA: Insufficient documentation

## 2023-05-17 LAB — CBC
HCT: 38.2 % (ref 36.0–46.0)
Hemoglobin: 12.3 g/dL (ref 12.0–15.0)
MCHC: 32.2 g/dL (ref 30.0–36.0)
MCV: 84.2 fL (ref 78.0–100.0)
Platelets: 251 10*3/uL (ref 150.0–400.0)
RBC: 4.54 Mil/uL (ref 3.87–5.11)
RDW: 19.8 % — ABNORMAL HIGH (ref 11.5–15.5)
WBC: 5 10*3/uL (ref 4.0–10.5)

## 2023-05-17 LAB — COMPREHENSIVE METABOLIC PANEL
ALT: 19 U/L (ref 0–35)
AST: 15 U/L (ref 0–37)
Albumin: 4.2 g/dL (ref 3.5–5.2)
Alkaline Phosphatase: 54 U/L (ref 39–117)
BUN: 16 mg/dL (ref 6–23)
CO2: 27 meq/L (ref 19–32)
Calcium: 9 mg/dL (ref 8.4–10.5)
Chloride: 106 meq/L (ref 96–112)
Creatinine, Ser: 0.64 mg/dL (ref 0.40–1.20)
GFR: 107.53 mL/min (ref >60.00)
Glucose, Bld: 104 mg/dL — ABNORMAL HIGH (ref 70–99)
Potassium: 4 meq/L (ref 3.5–5.1)
Sodium: 139 meq/L (ref 135–145)
Total Bilirubin: 0.3 mg/dL (ref 0.2–1.2)
Total Protein: 7.2 g/dL (ref 6.0–8.3)

## 2023-05-17 LAB — LIPASE: Lipase: 23 U/L (ref 11.0–59.0)

## 2023-05-17 MED ORDER — FLUCONAZOLE 150 MG PO TABS: 150.0000 mg | ORAL_TABLET | Freq: Once | ORAL | 0 refills | Status: AC

## 2023-05-17 MED ORDER — MUPIROCIN 2 % EX OINT: 1.0000 | TOPICAL_OINTMENT | Freq: Two times a day (BID) | CUTANEOUS | 0 refills | Status: AC

## 2023-05-17 NOTE — Assessment & Plan Note (Signed)
 Prior cholecystectomy and CT 2023 with diverticultiis in that area. She has chronic constipation not worse lately. Checking CMP, CBC, lipase to help rule out other causes. Ordered CT abdomen and pelvis to assess for current diverticulitis. Since she is improving will await this and no antibiotics prescribed. It is also possible she has scar tissue in the area from prior gallbladder removal.

## 2023-05-17 NOTE — Assessment & Plan Note (Signed)
 Rx mupirocin ointment to use on the left nostril for 1 week to help resolve.

## 2023-05-17 NOTE — Assessment & Plan Note (Signed)
 Rx diflucan to help with possible yeast infection
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
## 2023-05-21 ENCOUNTER — Other Ambulatory Visit: Payer: Medicaid Other

## 2023-05-25 ENCOUNTER — Ambulatory Visit
Admission: RE | Admit: 2023-05-25 | Discharge: 2023-05-25 | Disposition: A | Discharge status: Home / Self Care | Payer: No Typology Code available for payment source | Source: Ambulatory Visit | Attending: Internal Medicine | Admitting: Internal Medicine

## 2023-05-25 DIAGNOSIS — R1011 Right upper quadrant pain: Secondary | ICD-10-CM

## 2023-05-27 ENCOUNTER — Emergency Department (HOSPITAL_BASED_OUTPATIENT_CLINIC_OR_DEPARTMENT_OTHER): Payer: Self-pay | Admitting: Radiology

## 2023-05-27 ENCOUNTER — Ambulatory Visit: Payer: Self-pay | Admitting: Emergency Medicine

## 2023-05-27 ENCOUNTER — Other Ambulatory Visit: Payer: Self-pay

## 2023-05-27 DIAGNOSIS — B349 Viral infection, unspecified: Secondary | ICD-10-CM | POA: Insufficient documentation

## 2023-05-27 DIAGNOSIS — R0789 Other chest pain: Secondary | ICD-10-CM | POA: Insufficient documentation

## 2023-05-27 LAB — TROPONIN I (HIGH SENSITIVITY): Troponin I (High Sensitivity): <2 ng/L (ref <18)

## 2023-05-27 LAB — CBC
HCT: 38.5 % (ref 36.0–46.0)
Hemoglobin: 12.6 g/dL (ref 12.0–15.0)
MCH: 26.9 pg (ref 26.0–34.0)
MCHC: 32.7 g/dL (ref 30.0–36.0)
MCV: 82.3 fL (ref 80.0–100.0)
Platelets: 248 10*3/uL (ref 150–400)
RBC: 4.68 MIL/uL (ref 3.87–5.11)
RDW: 17.8 % — ABNORMAL HIGH (ref 11.5–15.5)
WBC: 4.8 10*3/uL (ref 4.0–10.5)
nRBC: 0 % (ref 0.0–0.2)

## 2023-05-27 LAB — PREGNANCY, URINE: Preg Test, Ur: NEGATIVE

## 2023-05-27 LAB — BASIC METABOLIC PANEL
Anion gap: 10 (ref 5–15)
BUN: 19 mg/dL (ref 6–20)
CO2: 24 mmol/L (ref 22–32)
Calcium: 9.1 mg/dL (ref 8.9–10.3)
Chloride: 103 mmol/L (ref 98–111)
Creatinine, Ser: 0.8 mg/dL (ref 0.44–1.00)
GFR, Estimated: >60 mL/min (ref >60)
Glucose, Bld: 87 mg/dL (ref 70–99)
Potassium: 3.7 mmol/L (ref 3.5–5.1)
Sodium: 137 mmol/L (ref 135–145)

## 2023-05-27 NOTE — ED Triage Notes (Signed)
Pt POV from home reporting intermittent L side chest pain and SOB past few weeks, worsened last night. Also reports cough that began yesterday, no fever/congestion.

## 2023-05-27 NOTE — Telephone Encounter (Signed)
Copied from CRM [id number]. Topic: Clinical - Red Word Triage >> May 27, 2023  4:56 PM Sim Boast F wrote: Red Word that prompted transfer to Nurse Triage: Patient has been having chest pain since yesterday into today   Chief Complaint: Chest pain Symptoms: Chest pain, cough, racing heart beat Frequency: Constant since yesterday  Pertinent Negatives: Patient denies shortness of breath  Disposition: [x] ED /[] Urgent Care (no appt availability in office) / [] Appointment(In office/virtual)/ []  [place] [facility]/ [] Home Care/ [] Refused Recommended Disposition /[] [place] [facility]/ []  Follow-up with PCP Additional Notes: Patient reports that she has been experiencing intermittent chest pain for the last 2-3 weeks that became constant yesterday. She states her pain is in her mid-chest and radiates to the left side between her chest and breast. She reports that her pain is 3/10 and feels like a pressure. Patient states she also intermittently feels like her heart is racing but has not checked the rate. Patient advised to go to the ED for evaluation and treatment of her symptoms. She understood and is agreeable with this plan.      Reason for Disposition  [1] Chest pain (or "angina") comes and goes AND [2] is happening more often (increasing in frequency) or getting worse (increasing in severity)  (Exception: Chest pains that last only a few seconds.)  Answer Assessment - Initial Assessment Questions 1. LOCATION: "Where does it hurt?"       Middle of chest  2. RADIATION: "Does the pain go anywhere else?" (e.g., into neck, jaw, arms, back)     Goes to left side between chest and breast  3. ONSET: "When did the chest pain begin?" (Minutes, hours or days)      A couple of weeks ago 4. PATTERN: "Does the pain come and go, or has it been constant since it started?"  "Does it get worse with exertion?"      Intermittent, constant since yesterday  5. DURATION: "How long does it last" (e.g., seconds,  minutes, hours)     Constant since yesterday  6. SEVERITY: "How bad is the pain?"  (e.g., Scale 1-10; mild, moderate, or severe)    - MILD (1-3): doesn't interfere with normal activities     - MODERATE (4-7): interferes with normal activities or awakens from sleep    - SEVERE (8-10): excruciating pain, unable to do any normal activities       3/10 7. CARDIAC RISK FACTORS: "Do you have any history of heart problems or risk factors for heart disease?" (e.g., angina, prior heart attack; diabetes, high blood pressure, high cholesterol, smoker, or strong family history of heart disease)     Hypertension  8. PULMONARY RISK FACTORS: "Do you have any history of lung disease?"  (e.g., blood clots in lung, asthma, emphysema, birth control pills)     No 9. CAUSE: "What do you think is causing the chest pain?"     Unsure  10. OTHER SYMPTOMS: "Do you have any other symptoms?" (e.g., dizziness, nausea, vomiting, sweating, fever, difficulty breathing, cough)       Racing heartbeat, cough 11. PREGNANCY: "Is there any chance you are pregnant?" "When was your last menstrual period?"       No  Protocols used: Chest Pain-A-AH

## 2023-05-28 ENCOUNTER — Emergency Department (HOSPITAL_BASED_OUTPATIENT_CLINIC_OR_DEPARTMENT_OTHER)
Admission: EM | Admit: 2023-05-28 | Discharge: 2023-05-28 | Disposition: A | Discharge status: Home / Self Care | Payer: Self-pay | Attending: Emergency Medicine | Admitting: Emergency Medicine

## 2023-05-28 DIAGNOSIS — B349 Viral infection, unspecified: Secondary | ICD-10-CM

## 2023-05-28 DIAGNOSIS — R058 Other specified cough: Secondary | ICD-10-CM

## 2023-05-28 DIAGNOSIS — R079 Chest pain, unspecified: Secondary | ICD-10-CM

## 2023-05-28 LAB — TROPONIN I (HIGH SENSITIVITY): Troponin I (High Sensitivity): <2 ng/L (ref <18)

## 2023-05-28 MED ORDER — ALBUTEROL SULFATE HFA 108 (90 BASE) MCG/ACT IN AERS: 2.0000 | INHALATION_SPRAY | RESPIRATORY_TRACT | 0 refills | Status: AC | PRN

## 2023-05-28 MED ORDER — PREDNISONE 50 MG PO TABS
60.0000 mg | ORAL_TABLET | Freq: Once | ORAL | Status: AC
  Administered 2023-05-28: 60 mg via ORAL
  Filled 2023-05-28: qty 1

## 2023-05-28 MED ORDER — IBUPROFEN 200 MG PO TABS
600.0000 mg | ORAL_TABLET | Freq: Once | ORAL | Status: AC
  Administered 2023-05-28: 600 mg via ORAL
  Filled 2023-05-28: qty 1

## 2023-05-28 MED ORDER — IPRATROPIUM-ALBUTEROL 0.5-2.5 (3) MG/3ML IN SOLN
3.0000 mL | Freq: Once | RESPIRATORY_TRACT | Status: AC
Start: 2023-05-28 — End: 2023-05-28
  Administered 2023-05-28: 3 mL via RESPIRATORY_TRACT
  Filled 2023-05-28: qty 3

## 2023-05-28 MED ORDER — PREDNISONE 50 MG PO TABS: 50.0000 mg | ORAL_TABLET | Freq: Every day | ORAL | 0 refills | Status: AC

## 2023-05-28 NOTE — ED Provider Notes (Signed)
[place] EMERGENCY DEPARTMENT AT [facility] Provider Note   CSN: [id number] Arrival date & time: 05/27/23  2212     History  Chief Complaint  Patient presents with   Chest Pain    Jenny Wagner is a 43 y.o. female.  The history is provided by the patient.  Chest Pain She has history of hypertension and comes in complaining of chest pain.  Pain is in the left parasternal area and described as a dull pain.  It has been present intermittently for the last 2 weeks, but has been constant through the day today.  Pain is worse with movement, but not with deep breaths.  She has had some mild dyspnea.  There has been a cough which is nonproductive.  She denies nausea, vomiting, diaphoresis.  She denies fever or chills.  She has had some additional aching in her back.  She has not taken anything for this.  She is a non-smoker and denies history of hyperlipidemia or diabetes.   Home Medications Prior to Admission medications   Medication Sig Start Date End Date Taking? Authorizing Provider  ferrous sulfate 325 (65 FE) MG tablet Take 1 tablet (325 mg total) by mouth daily with breakfast. Patient not taking: Reported on 05/17/2023 03/25/21   Hermina Staggers, MD  mupirocin ointment (BACTROBAN) 2 % Apply 1 Application topically 2 (two) times daily. 05/17/23   Myrlene Broker, MD  valsartan-hydrochlorothiazide (DIOVAN-HCT) 80-12.5 MG tablet TAKE 1 TABLET BY MOUTH DAILY 04/15/23   Georgina Quint, MD      Allergies    Patient has no known allergies.    Review of Systems   Review of Systems  Cardiovascular:  Positive for chest pain.  All other systems reviewed and are negative.   Physical Exam Updated Vital Signs BP 112/60   Pulse 65   Temp 97.7 F (36.5 C)   Resp 16   Ht 5' (1.524 m)   Wt 71.2 kg   LMP 04/24/2023 (Exact Date)   SpO2 99%   BMI 30.66 kg/m  Physical Exam Vitals and nursing note reviewed.   43 year old female, resting comfortably and in no  acute distress. Vital signs are normal. Oxygen saturation is 99%, which is normal. Head is normocephalic and atraumatic. PERRLA, EOMI. Oropharynx is clear. Neck is nontender and supple without adenopathy . Back is nontender and there is no CVA tenderness. Lungs are clear without rales, wheezes, or rhonchi.  Slight wheezing is noted with forced exhalation. Chest is mildly tender in the lower left parasternal area.  There is no crepitus. Heart has regular rate and rhythm without murmur. Abdomen is soft, flat, nontender. Extremities have no cyanosis or edema, full range of motion is present. Skin is warm and dry without rash. Neurologic: Mental status is normal, cranial nerves are intact, moves all extremities equally.  ED Results / Procedures / Treatments   Labs (all labs ordered are listed, but only abnormal results are displayed) Labs Reviewed  CBC - Abnormal; Notable for the following components:      Result Value   RDW 17.8 (*)    All other components within normal limits  BASIC METABOLIC PANEL  PREGNANCY, URINE  TROPONIN I (HIGH SENSITIVITY)  TROPONIN I (HIGH SENSITIVITY)    EKG EKG Interpretation Date/Time:  Thursday May 27 2023 22:44:20 EST Ventricular Rate:  84 PR Interval:  148 QRS Duration:  84 QT Interval:  374 QTC Calculation: 441 R Axis:   61  Text Interpretation: Normal  sinus rhythm Normal ECG No previous ECGs available Confirmed by Dione Booze ([id number]) on 05/28/2023 12:10:35 AM  Radiology DG Chest 2 View Result Date: 05/27/2023 CLINICAL DATA:  Left chest pain and shortness of breath the past few weeks worsening last night. EXAM: CHEST - 2 VIEW COMPARISON:  PA and lateral chest 04/02/2021 FINDINGS: The heart size and mediastinal contours are within normal limits. Both lungs are clear. The visualized skeletal structures are unremarkable. IMPRESSION: No evidence of acute chest disease or interval changes. Electronically Signed   By: Almira Bar M.D.   On:  05/27/2023 23:13    Procedures Procedures    Medications Ordered in ED Medications  predniSONE (DELTASONE) tablet 60 mg (has no administration in time range)  ipratropium-albuterol (DUONEB) 0.5-2.5 (3) MG/3ML nebulizer solution 3 mL (3 mLs Nebulization Given 05/28/23 0223)  ibuprofen (ADVIL) tablet 600 mg (600 mg Oral Given 05/28/23 0238)    ED Course/ Medical Decision Making/ A&P             HEART Score: 1                    Medical Decision Making Amount and/or Complexity of Data Reviewed Labs: ordered. Radiology: ordered.  Risk Prescription drug management.   Chest pain which may be part of a viral syndrome, possibly musculoskeletal pain.  I have low index of suspicion for ACS or pulmonary edema, doubt pneumonia.  I have reviewed her electrocardiogram, and my interpretation is normal ECG.  Chest x-ray shows no acute cardiopulmonary process.  I have independently viewed the images, and agree with the radiologist's interpretation.  I reviewed her laboratory tests, and my interpretation is normal CBC, normal basic metabolic panel, normal troponin with repeat troponin pending.  Heart score is 1, which puts her at low risk for major adverse cardiac events in the next 6 weeks.  I have ordered a dose of ibuprofen and a therapeutic trial of albuterol and ipratropium via nebulizer.  She noted significant improvement with nebulizer treatment with albuterol and ipratropium.  I am discharging her with prescriptions for prednisone and albuterol inhaler, initial dose of prednisone ordered to be given in the emergency department.  I have advised her to use over-the-counter NSAIDs and acetaminophen as needed for pain.  Final Clinical Impression(s) / ED Diagnoses Final diagnoses:  Nonspecific chest pain  Nonproductive cough  Viral illness    Rx / DC Orders ED Discharge Orders          Ordered    albuterol (VENTOLIN HFA) 108 (90 Base) MCG/ACT inhaler  Every 4 hours PRN        05/28/23 0316     predniSONE (DELTASONE) 50 MG tablet  Daily        05/28/23 0316              Dione Booze, MD 05/28/23 [phone]

## 2023-05-28 NOTE — Discharge Instructions (Addendum)
Drink plenty of fluids.  Take ibuprofen or acetaminophen as needed for fever or pain.  Also, be aware that if you combine ibuprofen and acetaminophen, you get better relief of fever and pain then if you take either medication by itself.  Use the inhaler every 4 hours as needed to help with shortness of breath and coughing.

## 2023-05-31 ENCOUNTER — Encounter: Payer: Self-pay | Admitting: Internal Medicine

## 2023-06-02 ENCOUNTER — Encounter: Payer: Self-pay | Admitting: Emergency Medicine

## 2023-06-02 ENCOUNTER — Ambulatory Visit: Payer: Self-pay | Admitting: Emergency Medicine

## 2023-06-02 VITALS — BP 120/78 | HR 72 | Temp 97.5°F | Ht 60.0 in | Wt 156.0 lb

## 2023-06-02 DIAGNOSIS — I10 Essential (primary) hypertension: Secondary | ICD-10-CM

## 2023-06-02 DIAGNOSIS — M940 Chondrocostal junction syndrome [Tietze]: Secondary | ICD-10-CM

## 2023-06-02 NOTE — Assessment & Plan Note (Signed)
Well-controlled hypertension. Continue Diovan HCTZ 80-12.5 mg daily Cardiovascular risk associated with hypertension discussed Dietary approaches to stop hypertension discussed

## 2023-06-02 NOTE — Patient Instructions (Signed)
Costocondritis Costochondritis  La costocondritis es la irritacin e hinchazn (inflamacin) del tejido que une las costillas con el esternn. Este tejido se Consulting civil engineer. Esta afeccin causa dolor en la parte frontal del pecho. El dolor suele comenzar lentamente. Puede estar presente en ms de una costilla. Cules son las causas? No siempre se conoce la causa de esta afeccin. Puede deberse a sobrecarga en el esternn. La causa de esta sobrecarga puede ser la siguiente: Lesin torcica. Ejercicio o actividad. Esto puede Dealer. Tos muy intensa. Qu incrementa el riesgo? Ser mujer. Tener entre 30 y 40 aos. Comenzar un nuevo ejercicio o una nueva actividad laboral. Wilburt Finlay niveles bajos de vitamina D. Tener una afeccin que le haga toser [place]. Cules son los signos o sntomas? Dolor en el pecho con las siguientes caractersticas: Comienza lentamente. Puede ser agudo o sordo. Empeora al respirar, toser o hacer ejercicio. Mejora con el reposo. Puede empeorar al presionar las costillas y el esternn. Cmo se trata? En la [employer], [place] afeccin desaparece sola con el Dime Box. Es posible que deba tomar antiinflamatorios no esteroideos (AINE), como ibuprofeno. Esto puede ayudar a Glass blower/designer. Podra tambin necesitar: Hacer reposo y [employer] de las actividades que empeoren Chief Technology Officer. Aplicar calor o hielo en la zona que duele. Hacer ejercicios para elongar los msculos del trax. Si estos tratamientos no ayudan, el mdico puede inyectarle un medicamento para adormecer la zona. Esto puede ayudar a Engineer, materials. Siga estas instrucciones en su casa: Control del dolor, la rigidez y la hinchazn     Si se lo indican, aplique hielo sobre la zona dolorida. Para hacer esto: Ponga el hielo en una bolsa plstica. Coloque una toalla entre la piel y Copy. Aplique el hielo durante 20 minutos, 2 a 3 veces por da. Si se lo indican, aplique calor  en la zona afectada. Hgalo con la frecuencia que le haya indicado el mdico. Use la fuente de calor que el mdico le recomiende, como una compresa de calor hmedo o una almohadilla trmica. Coloque una toalla entre la piel y la fuente de Airline pilot. Aplique calor durante 20 a 30 minutos. Si la piel se le pone de color rojo brillante, quite el hielo o el calor de inmediato para evitar daos en la piel. El riesgo de dao en la piel es mayor si no puede sentir dolor, calor o fro. Actividad Haga reposo como se lo haya indicado el mdico. No haga cosas que Warden/ranger. Esto incluye cualquier [employer] del trax, el vientre (abdomen) y los laterales. Es posible que Personnel officer objetos. Pregntele a su mdico cunto Database administrator con seguridad. Retome sus actividades normales cuando el mdico le diga que es seguro. Instrucciones generales [employer] de venta libre y los recetados solamente como se lo haya indicado el mdico. Comunquese con un mdico si: Siente escalofros o tiene fiebre. El dolor no desaparece o empeora. Tiene tos que no desaparece. Solicite ayuda de inmediato si: Tiene dificultad para respirar. Tiene dolor muy intenso en el trax que no mejora con medicamentos, con hielo o con calor. Estos sntomas pueden Customer service manager. Solicite ayuda de inmediato. Llame al 911. No espere a ver si los sntomas desaparecen. No conduzca por sus propios medios [employer]. Esta informacin no tiene Theme park manager el consejo del mdico. Asegrese de hacerle al mdico cualquier pregunta que tenga. Document Revised: 11/29/2021 Document Reviewed: 11/29/2021 Elsevier Patient Education  2024 Elsevier Inc.

## 2023-06-02 NOTE — Progress Notes (Signed)
[facility] 43 y.o.   Chief Complaint  Patient presents with   Breast Pain    Patient states it starts at the middle of her chest and moves to the left of her breast. She states she has 2 new white spots on her breast and her left leg. She's been having these pains for about 2-3 months. Did go to the ED and had a CT, xray, heart monitoring . And left with no concerns with the imaging     HISTORY OF PRESENT ILLNESS: This is a 43 y.o. female here for follow-up of emergency department visit on 05/28/2023 when she presented complaining of chest pain Negative workup.  Doing well. Also complaining of white spots on her legs and chest area.  Has seen dermatologist in the past for the same.  Benign lesions. Emergency department visit notes as follows: Chest pain which may be part of a viral syndrome, possibly musculoskeletal pain.  I have low index of suspicion for ACS or pulmonary edema, doubt pneumonia.  I have reviewed her electrocardiogram, and my interpretation is normal ECG.  Chest x-ray shows no acute cardiopulmonary process.  I have independently viewed the images, and agree with the radiologist's interpretation.  I reviewed her laboratory tests, and my interpretation is normal CBC, normal basic metabolic panel, normal troponin with repeat troponin pending.  Heart score is 1, which puts her at low risk for major adverse cardiac events in the next 6 weeks.  I have ordered a dose of ibuprofen and a therapeutic trial of albuterol and ipratropium via nebulizer.   She noted significant improvement with nebulizer treatment with albuterol and ipratropium.  I am discharging her with prescriptions for prednisone and albuterol inhaler, initial dose of prednisone ordered to be given in the emergency department.  I have advised her to use over-the-counter NSAIDs and acetaminophen as needed for pain.  HPI   Prior to Admission medications   Medication Sig Start Date End Date Taking? Authorizing Provider   albuterol (VENTOLIN HFA) 108 (90 Base) MCG/ACT inhaler Inhale 2 puffs into the lungs every 4 (four) hours as needed for wheezing or shortness of breath. 05/28/23  Yes Dione Booze, MD  mupirocin ointment (BACTROBAN) 2 % Apply 1 Application topically 2 (two) times daily. 05/17/23  Yes Myrlene Broker, MD  predniSONE (DELTASONE) 50 MG tablet Take 1 tablet (50 mg total) by mouth daily. 05/28/23  Yes Dione Booze, MD  valsartan-hydrochlorothiazide (DIOVAN-HCT) 80-12.5 MG tablet TAKE 1 TABLET BY MOUTH DAILY 04/15/23  Yes Georgina Quint, MD  ferrous sulfate 325 (65 FE) MG tablet Take 1 tablet (325 mg total) by mouth daily with breakfast. Patient not taking: Reported on 03/02/2023 03/25/21   Hermina Staggers, MD    No Known Allergies  Patient Active Problem List   Diagnosis Date Noted   RUQ pain 05/17/2023   Nose irritation 05/17/2023   Diverticulosis of colon 03/02/2023   Generalized abdominal pain 03/02/2023   Essential hypertension 01/21/2022   Chronic tension-type headache, not intractable 01/21/2022   Fibroid 04/10/2021   Menorrhagia with regular cycle 03/19/2021   Mild reactive airways disease 06/04/2017   Vaginal itching 09/28/2012    Past Medical History:  Diagnosis Date   Depression    Hypertension     Past Surgical History:  Procedure Laterality Date   CHOLECYSTECTOMY  2007    Social History   Socioeconomic History   Marital status: Married    Spouse name: Not on file   Number of children: 2  Years of education: Not on file   Highest education level: Not on file  Occupational History   Occupation: Stay at home mom  Tobacco Use   Smoking status: Never   Smokeless tobacco: Never  Vaping Use   Vaping status: Never Used  Substance and Sexual Activity   Alcohol use: No   Drug use: No   Sexual activity: Yes    Birth control/protection: None  Other Topics Concern   Not on file  Social History Narrative   Not on file   Social Drivers of Health    Financial Resource Strain: Not on file  Food Insecurity: Not on file  Transportation Needs: Not on file  Physical Activity: Not on file  Stress: Not on file  Social Connections: Unknown (03/18/2022)   Received from [facility], [facility]   Social Network    Social Network: Not on file  Intimate Partner Violence: Unknown (03/18/2022)   Received from [employer], [facility]   HITS    Physically Hurt: Not on file    Insult or Talk Down To: Not on file    Threaten Physical Harm: Not on file    Scream or Curse: Not on file    Family History  Problem Relation Age of Onset   Pancreatic cancer Mother    Hypertension Other    Diabetes Other    Colon cancer Neg Hx    Esophageal cancer Neg Hx    Liver cancer Neg Hx    Rectal cancer Neg Hx    Stomach cancer Neg Hx      Review of Systems  Constitutional: Negative.  Negative for chills and fever.  HENT: Negative.  Negative for congestion and sore throat.   Respiratory: Negative.  Negative for cough and shortness of breath.   Cardiovascular:  Positive for chest pain.  Gastrointestinal:  Negative for abdominal pain, nausea and vomiting.  Genitourinary: Negative.  Negative for dysuria and hematuria.  Skin: Negative.   Neurological:  Negative for dizziness and headaches.  All other systems reviewed and are negative.   Vitals:   06/02/23 1617  BP: 120/78  Pulse: 72  Temp: (!) 97.5 F (36.4 C)  SpO2: 98%    Physical Exam Vitals reviewed.  Constitutional:      Appearance: Normal appearance.  HENT:     Head: Normocephalic.     Mouth/Throat:     Mouth: Mucous membranes are moist.     Pharynx: Oropharynx is clear.  Eyes:     Extraocular Movements: Extraocular movements intact.     Pupils: Pupils are equal, round, and reactive to light.  Cardiovascular:     Rate and Rhythm: Normal rate and regular rhythm.     Pulses: Normal pulses.     Heart sounds: Normal heart sounds.  Pulmonary:     Effort: Pulmonary  effort is normal.     Breath sounds: Normal breath sounds.  Chest:     Chest wall: Tenderness (Pain localized to left sternal border.  Pain reproduced with palpation) present.  Musculoskeletal:     Cervical back: No tenderness.     Comments: Benign white spots on left chest area  Lymphadenopathy:     Cervical: No cervical adenopathy.  Skin:    General: Skin is warm and dry.     Capillary Refill: Capillary refill takes less than 2 seconds.  Neurological:     General: No focal deficit present.     Mental Status: She is alert and oriented to person,  place, and time.  Psychiatric:        Mood and Affect: Mood normal.        Behavior: Behavior normal.      ASSESSMENT & PLAN: A total of 35 minutes was spent with the patient and counseling/coordination of care regarding preparing for this visit, review of most recent office visit notes, review of most recent emergency department visit notes, review of most recent imaging reports, review of most recent EKG done, review of multiple chronic medical conditions and their management, review of all medications, review of most recent bloodwork results, review of health maintenance items, education on nutrition, prognosis, documentation, and need for follow up.   Problem List Items Addressed This Visit       Cardiovascular and Mediastinum   Essential hypertension   Well-controlled hypertension. Continue Diovan HCTZ 80-12.5 mg daily Cardiovascular risk associated with hypertension discussed Dietary approaches to stop hypertension discussed        Musculoskeletal and Integument   Costochondritis - Primary   Symptoms reproduced with chest wall palpation Pain management discussed May take Tylenol and or Advil as needed for pain Negative cardiac workup No concerns      Patient Instructions  Costocondritis Costochondritis  La costocondritis es la irritacin e hinchazn (inflamacin) del tejido que une las costillas con el esternn. Este  tejido se Consulting civil engineer. Esta afeccin causa dolor en la parte frontal del pecho. El dolor suele comenzar lentamente. Puede estar presente en ms de una costilla. Cules son las causas? No siempre se conoce la causa de esta afeccin. Puede deberse a sobrecarga en el esternn. La causa de esta sobrecarga puede ser la siguiente: Lesin torcica. Ejercicio o actividad. Esto puede Dealer. Tos muy intensa. Qu incrementa el riesgo? Ser mujer. Tener entre 30 y 40 aos. Comenzar un nuevo ejercicio o una nueva actividad laboral. Wilburt Finlay niveles bajos de vitamina D. Tener una afeccin que le haga toser [place]. Cules son los signos o sntomas? Dolor en el pecho con las siguientes caractersticas: Comienza lentamente. Puede ser agudo o sordo. Empeora al respirar, toser o hacer ejercicio. Mejora con el reposo. Puede empeorar al presionar las costillas y el esternn. Cmo se trata? En la [employer], [place] afeccin desaparece sola con el [place]. Es posible que deba tomar antiinflamatorios no esteroideos (AINE), como ibuprofeno. Esto puede ayudar a Glass blower/designer. Podra tambin necesitar: Hacer reposo y [employer] de las actividades que empeoren Chief Technology Officer. Aplicar calor o hielo en la zona que duele. Hacer ejercicios para elongar los msculos del trax. Si estos tratamientos no ayudan, el mdico puede inyectarle un medicamento para adormecer la zona. Esto puede ayudar a Engineer, materials. Siga estas instrucciones en su casa: Control del dolor, la rigidez y la hinchazn     Si se lo indican, aplique hielo sobre la zona dolorida. Para hacer esto: Ponga el hielo en una bolsa plstica. Coloque una toalla entre la piel y Copy. Aplique el hielo durante 20 minutos, 2 a 3 veces por da. Si se lo indican, aplique calor en la zona afectada. Hgalo con la frecuencia que le haya indicado el mdico. Use la fuente de calor que el mdico le recomiende, como una compresa de  calor hmedo o una almohadilla trmica. Coloque una toalla entre la piel y la fuente de Airline pilot. Aplique calor durante 20 a 30 minutos. Si la piel se le pone de color rojo brillante, quite el hielo o el calor de inmediato para evitar  daos en la piel. El riesgo de dao en la piel es mayor si no puede sentir dolor, calor o fro. Actividad Haga reposo como se lo haya indicado el mdico. No haga cosas que Warden/ranger. Esto incluye cualquier [employer] del trax, el vientre (abdomen) y los laterales. Es posible que Personnel officer objetos. Pregntele a su mdico cunto Database administrator con seguridad. Retome sus actividades normales cuando el mdico le diga que es seguro. Instrucciones generales [employer] de venta libre y los recetados solamente como se lo haya indicado el mdico. Comunquese con un mdico si: Siente escalofros o tiene fiebre. El dolor no desaparece o empeora. Tiene tos que no desaparece. Solicite ayuda de inmediato si: Tiene dificultad para respirar. Tiene dolor muy intenso en el trax que no mejora con medicamentos, con hielo o con calor. Estos sntomas pueden Customer service manager. Solicite ayuda de inmediato. Llame al 911. No espere a ver si los sntomas desaparecen. No conduzca por sus propios medios [employer]. Esta informacin no tiene Theme park manager el consejo del mdico. Asegrese de hacerle al mdico cualquier pregunta que tenga. Document Revised: 11/29/2021 Document Reviewed: 11/29/2021 Elsevier Patient Education  2024 Elsevier Inc.     Edwina Barth, MD [facility]

## 2023-06-02 NOTE — Assessment & Plan Note (Signed)
Symptoms reproduced with chest wall palpation Pain management discussed May take Tylenol and or Advil as needed for pain Negative cardiac workup No concerns

## 2023-06-07 ENCOUNTER — Ambulatory Visit (INDEPENDENT_AMBULATORY_CARE_PROVIDER_SITE_OTHER): Payer: Self-pay | Admitting: Emergency Medicine

## 2023-06-07 ENCOUNTER — Encounter: Payer: Self-pay | Admitting: Emergency Medicine

## 2023-06-07 VITALS — BP 126/82 | HR 70 | Temp 98.2°F | Ht 60.0 in | Wt 157.0 lb

## 2023-06-07 DIAGNOSIS — M2550 Pain in unspecified joint: Secondary | ICD-10-CM | POA: Insufficient documentation

## 2023-06-07 DIAGNOSIS — R21 Rash and other nonspecific skin eruption: Secondary | ICD-10-CM | POA: Insufficient documentation

## 2023-06-07 NOTE — Assessment & Plan Note (Signed)
Clinically stable.  Rash not present on today's examination Differential diagnosis discussed Recommend additional blood work including ANA, RA, sedimentation rate, CRP

## 2023-06-07 NOTE — Assessment & Plan Note (Signed)
No signs of active inflammation on physical exam today No deformities or inflammation noted on different joints Differential diagnosis discussed including possibility of lupus or RA Blood work done today May need rheumatology evaluation in the near future

## 2023-06-07 NOTE — Patient Instructions (Signed)

## 2023-06-07 NOTE — Progress Notes (Signed)
[facility] 43 y.o.   Chief Complaint  Patient presents with   Rash    Joint pain and tingling in hands, small lump on back of head     HISTORY OF PRESENT ILLNESS: This is a 43 y.o. female recently seen at urgent care center with rash, myalgia, and joint pains.  Was advised to follow-up with PCP for further testing. Symptoms much better today. No other complaint or medical concerns today  Rash Associated symptoms include joint pain. Pertinent negatives include no congestion, cough, diarrhea, shortness of breath, sore throat or vomiting.     Prior to Admission medications   Medication Sig Start Date End Date Taking? Authorizing Provider  valsartan-hydrochlorothiazide (DIOVAN-HCT) 80-12.5 MG tablet TAKE 1 TABLET BY MOUTH DAILY 04/15/23  Yes Oskar Cretella, Eilleen Kempf, MD  albuterol (VENTOLIN HFA) 108 (90 Base) MCG/ACT inhaler Inhale 2 puffs into the lungs every 4 (four) hours as needed for wheezing or shortness of breath. Patient not taking: Reported on 06/07/2023 05/28/23   Dione Booze, MD  ferrous sulfate 325 (65 FE) MG tablet Take 1 tablet (325 mg total) by mouth daily with breakfast. Patient not taking: Reported on 06/07/2023 03/25/21   Hermina Staggers, MD  mupirocin ointment (BACTROBAN) 2 % Apply 1 Application topically 2 (two) times daily. Patient not taking: Reported on 06/07/2023 05/17/23   Myrlene Broker, MD  predniSONE (DELTASONE) 50 MG tablet Take 1 tablet (50 mg total) by mouth daily. Patient not taking: Reported on 06/07/2023 05/28/23   Dione Booze, MD    No Known Allergies  Patient Active Problem List   Diagnosis Date Noted   Costochondritis 06/02/2023   Nose irritation 05/17/2023   Diverticulosis of colon 03/02/2023   Essential hypertension 01/21/2022   Chronic tension-type headache, not intractable 01/21/2022   Fibroid 04/10/2021   Menorrhagia with regular cycle 03/19/2021   Mild reactive airways disease 06/04/2017    Past Medical History:  Diagnosis Date    Depression    Hypertension     Past Surgical History:  Procedure Laterality Date   CHOLECYSTECTOMY  2007    Social History   Socioeconomic History   Marital status: Married    Spouse name: Not on file   Number of children: 2   Years of education: Not on file   Highest education level: Not on file  Occupational History   Occupation: Stay at home mom  Tobacco Use   Smoking status: Never   Smokeless tobacco: Never  Vaping Use   Vaping status: Never Used  Substance and Sexual Activity   Alcohol use: No   Drug use: No   Sexual activity: Yes    Birth control/protection: None  Other Topics Concern   Not on file  Social History Narrative   Not on file   Social Drivers of Health   Financial Resource Strain: Not on file  Food Insecurity: Not on file  Transportation Needs: Not on file  Physical Activity: Not on file  Stress: Not on file  Social Connections: Unknown (03/18/2022)   Received from [facility], [facility]   Social Network    Social Network: Not on file  Intimate Partner Violence: Unknown (03/18/2022)   Received from [employer], [facility]   HITS    Physically Hurt: Not on file    Insult or Talk Down To: Not on file    Threaten Physical Harm: Not on file    Scream or Curse: Not on file    Family History  Problem Relation  Age of Onset   Pancreatic cancer Mother    Hypertension Other    Diabetes Other    Colon cancer Neg Hx    Esophageal cancer Neg Hx    Liver cancer Neg Hx    Rectal cancer Neg Hx    Stomach cancer Neg Hx      Review of Systems  Constitutional:  Positive for malaise/fatigue.  HENT: Negative.  Negative for congestion and sore throat.   Respiratory: Negative.  Negative for cough and shortness of breath.   Cardiovascular: Negative.  Negative for chest pain and palpitations.  Gastrointestinal:  Negative for abdominal pain, diarrhea, nausea and vomiting.  Genitourinary: Negative.   Musculoskeletal:  Positive for joint pain  and myalgias.  Skin:  Positive for rash.  Neurological: Negative.  Negative for dizziness and headaches.    Vitals:   06/07/23 1605  BP: 126/82  Pulse: 70  Temp: 98.2 F (36.8 C)  SpO2: 100%    Physical Exam Vitals reviewed.  Constitutional:      Appearance: Normal appearance.  HENT:     Head: Normocephalic.  Eyes:     Extraocular Movements: Extraocular movements intact.     Pupils: Pupils are equal, round, and reactive to light.  Cardiovascular:     Rate and Rhythm: Normal rate and regular rhythm.     Pulses: Normal pulses.     Heart sounds: Normal heart sounds.  Pulmonary:     Effort: Pulmonary effort is normal.     Breath sounds: Normal breath sounds.  Musculoskeletal:        General: No swelling, tenderness or deformity.     Cervical back: No tenderness.  Lymphadenopathy:     Cervical: No cervical adenopathy.  Skin:    General: Skin is warm and dry.     Findings: No rash.  Neurological:     Mental Status: She is alert and oriented to person, place, and time.  Psychiatric:        Mood and Affect: Mood normal.        Behavior: Behavior normal.      ASSESSMENT & PLAN: A total of 33 minutes was spent with the patient and counseling/coordination of care regarding preparing for this visit, review of most recent office visit notes, review of multiple chronic medical conditions and their management, review of all medications, review of most recent bloodwork results, review of health maintenance items, education on nutrition, prognosis, documentation, and need for follow up.  Problem List Items Addressed This Visit       Musculoskeletal and Integument   Rash and nonspecific skin eruption - Primary   Clinically stable.  Rash not present on today's examination Differential diagnosis discussed Recommend additional blood work including ANA, RA, sedimentation rate, CRP      Relevant Orders   ANA,IFA RA Diag Pnl w/rflx Tit/Patn   Sedimentation rate   C-reactive  protein     Other   Arthralgia of multiple joints   No signs of active inflammation on physical exam today No deformities or inflammation noted on different joints Differential diagnosis discussed including possibility of lupus or RA Blood work done today May need rheumatology evaluation in the near future      Relevant Orders   ANA,IFA RA Diag Pnl w/rflx Tit/Patn   Sedimentation rate   C-reactive protein   Patient Instructions  Health Maintenance, Female Adopting a healthy lifestyle and getting preventive care are important in promoting health and wellness. Ask your health care provider about: The  right schedule for you to have regular tests and exams. Things you can do on your own to prevent diseases and keep yourself healthy. What should I know about diet, weight, and exercise? Eat a healthy diet  Eat a diet that includes plenty of vegetables, fruits, low-fat dairy products, and lean protein. Do not eat a lot of foods that are high in solid fats, added sugars, or sodium. Maintain a healthy weight Body mass index (BMI) is used to identify weight problems. It estimates body fat based on height and weight. Your health care provider can help determine your BMI and help you achieve or maintain a healthy weight. Get regular exercise Get regular exercise. This is one of the most important things you can do for your health. Most adults should: Exercise for at least 150 minutes each week. The exercise should increase your heart rate and make you sweat (moderate-intensity exercise). Do strengthening exercises at least twice a week. This is in addition to the moderate-intensity exercise. Spend less time sitting. Even light physical activity can be beneficial. Watch cholesterol and blood lipids Have your blood tested for lipids and cholesterol at 45 years of age, then have this test every 5 years. Have your cholesterol levels checked more often if: Your lipid or cholesterol levels are  high. You are older than 44 years of age. You are at high risk for heart disease. What should I know about cancer screening? Depending on your health history and family history, you may need to have cancer screening at various ages. This may include screening for: Breast cancer. Cervical cancer. Colorectal cancer. Skin cancer. Lung cancer. What should I know about heart disease, diabetes, and high blood pressure? Blood pressure and heart disease High blood pressure causes heart disease and increases the risk of stroke. This is more likely to develop in people who have high blood pressure readings or are overweight. Have your blood pressure checked: Every 3-5 years if you are 20-49 years of age. Every year if you are 35 years old or older. Diabetes Have regular diabetes screenings. This checks your fasting blood sugar level. Have the screening done: Once every three years after age 58 if you are at a normal weight and have a low risk for diabetes. More often and at a younger age if you are overweight or have a high risk for diabetes. What should I know about preventing infection? Hepatitis B If you have a higher risk for hepatitis B, you should be screened for this virus. Talk with your health care provider to find out if you are at risk for hepatitis B infection. Hepatitis C Testing is recommended for: Everyone born from [birth year] through 1967. Anyone with known risk factors for hepatitis C. Sexually transmitted infections (STIs) Get screened for STIs, including gonorrhea and chlamydia, if: You are sexually active and are younger than 45 years of age. You are older than 45 years of age and your health care provider tells you that you are at risk for this type of infection. Your sexual activity has changed since you were last screened, and you are at increased risk for chlamydia or gonorrhea. Ask your health care provider if you are at risk. Ask your health care provider about whether you  are at high risk for HIV. Your health care provider may recommend a prescription medicine to help prevent HIV infection. If you choose to take medicine to prevent HIV, you should first get tested for HIV. You should then be tested every 3  months for as long as you are taking the medicine. Pregnancy If you are about to stop having your period (premenopausal) and you may become pregnant, seek counseling before you get pregnant. Take 400 to 800 micrograms (mcg) of folic acid every day if you become pregnant. Ask for birth control (contraception) if you want to prevent pregnancy. Osteoporosis and menopause Osteoporosis is a disease in which the bones lose minerals and strength with aging. This can result in bone fractures. If you are 67 years old or older, or if you are at risk for osteoporosis and fractures, ask your health care provider if you should: Be screened for bone loss. Take a calcium or vitamin D supplement to lower your risk of fractures. Be given hormone replacement therapy (HRT) to treat symptoms of menopause. Follow these instructions at home: Alcohol use Do not drink alcohol if: Your health care provider tells you not to drink. You are pregnant, may be pregnant, or are planning to become pregnant. If you drink alcohol: Limit how much you have to: 0-1 drink a day. Know how much alcohol is in your drink. In the [place] one drink equals one 12 oz bottle of beer (355 mL), one 5 oz glass of wine (148 mL), or one 1 oz glass of hard liquor (44 mL). Lifestyle Do not use any products that contain nicotine or tobacco. These products include cigarettes, chewing tobacco, and vaping devices, such as e-cigarettes. If you need help quitting, ask your health care provider. Do not use street drugs. Do not share needles. Ask your health care provider for help if you need support or information about quitting drugs. General instructions Schedule regular health, dental, and eye exams. Stay current  with your vaccines. Tell your health care provider if: You often feel depressed. You have ever been abused or do not feel safe at home. Summary Adopting a healthy lifestyle and getting preventive care are important in promoting health and wellness. Follow your health care provider's instructions about healthy diet, exercising, and getting tested or screened for diseases. Follow your health care provider's instructions on monitoring your cholesterol and blood pressure. This information is not intended to replace advice given to you by your health care provider. Make sure you discuss any questions you have with your health care provider. Document Revised: 09/09/2020 Document Reviewed: 09/09/2020 Elsevier Patient Education  2024 Elsevier Inc.    Edwina Barth, MD  Primary Care at [facility]

## 2023-06-08 LAB — C-REACTIVE PROTEIN: CRP: <1 mg/dL (ref 0.5–20.0)

## 2023-06-08 LAB — SEDIMENTATION RATE: Sed Rate: 23 mm/h — ABNORMAL HIGH (ref 0–20)

## 2023-06-09 ENCOUNTER — Other Ambulatory Visit: Payer: Self-pay | Admitting: Emergency Medicine

## 2023-06-09 ENCOUNTER — Encounter: Payer: Self-pay | Admitting: Emergency Medicine

## 2023-06-09 DIAGNOSIS — R768 Other specified abnormal immunological findings in serum: Secondary | ICD-10-CM

## 2023-06-09 DIAGNOSIS — M790 Rheumatism, unspecified: Secondary | ICD-10-CM

## 2023-06-09 LAB — ANA,IFA RA DIAG PNL W/RFLX TIT/PATN
Anti Nuclear Antibody (ANA): POSITIVE — AB
Cyclic Citrullin Peptide Ab: <16 U
Rheumatoid fact SerPl-aCnc: <10 [IU]/mL (ref <14)

## 2023-06-09 LAB — ANTI-NUCLEAR AB-TITER (ANA TITER): ANA Titer 1: 1:40 {titer} — ABNORMAL HIGH

## 2023-08-09 ENCOUNTER — Telehealth: Payer: Self-pay | Admitting: Emergency Medicine

## 2023-08-09 NOTE — Telephone Encounter (Signed)
 Copied from CRM [id number]. Topic: Referral - Question >> Aug 09, 2023  9:58 AM Saverio Danker wrote: Reason for CRM: Patient is calling because her current referral for her rheumatology appointment is months out. She would like to know if the provider can be changed on her current referral to Dr Glenna Durand k. Patel at the [facility]

## 2023-08-10 NOTE — Addendum Note (Signed)
 Addended by: Aundra Millet on: 08/10/2023 11:13 AM   Modules accepted: Orders

## 2023-08-16 NOTE — Telephone Encounter (Signed)
 Copied from CRM [id number]. Topic: Referral - Request for Referral >> Aug 16, 2023  3:28 PM Jenny Wagner wrote: Did the patient discuss referral with their provider in the last year? Yes (If No - schedule appointment) (If Yes - send message)  Appointment offered? Yes    Type of order/referral and detailed reason for visit:Patient stated she spoke with someone regarding an appointment  and was informed that scheduling is two months out. She is requesting if a referral can be made with Jenny Wagner  instead.   Preference of office, provider, location: Jenny Wagner [address] [place], [place] [address] [phone]  If referral order, have you been seen by this specialty before? No (If Yes, this issue or another issue? When? Where?  Can we respond through MyChart? Yes

## 2023-08-19 NOTE — Telephone Encounter (Signed)
 Since Dr.Patel is "out of network", we have to print and fax the referral over to them. Printed referral requires Dr.Sagardia's signature. Will route to him

## 2023-09-10 ENCOUNTER — Emergency Department (HOSPITAL_BASED_OUTPATIENT_CLINIC_OR_DEPARTMENT_OTHER)
Admission: EM | Admit: 2023-09-10 | Discharge: 2023-09-10 | Disposition: A | Discharge status: Home / Self Care | Payer: Self-pay | Attending: Emergency Medicine | Admitting: Emergency Medicine

## 2023-09-10 ENCOUNTER — Other Ambulatory Visit: Payer: Self-pay

## 2023-09-10 ENCOUNTER — Encounter (HOSPITAL_BASED_OUTPATIENT_CLINIC_OR_DEPARTMENT_OTHER): Payer: Self-pay | Admitting: Emergency Medicine

## 2023-09-10 ENCOUNTER — Emergency Department (HOSPITAL_BASED_OUTPATIENT_CLINIC_OR_DEPARTMENT_OTHER): Payer: Self-pay

## 2023-09-10 DIAGNOSIS — R051 Acute cough: Secondary | ICD-10-CM

## 2023-09-10 DIAGNOSIS — J069 Acute upper respiratory infection, unspecified: Secondary | ICD-10-CM | POA: Insufficient documentation

## 2023-09-10 LAB — RESP PANEL BY RT-PCR (RSV, FLU A&B, COVID)  RVPGX2
Influenza A by PCR: NEGATIVE
Influenza B by PCR: NEGATIVE
Resp Syncytial Virus by PCR: NEGATIVE
SARS Coronavirus 2 by RT PCR: NEGATIVE

## 2023-09-10 MED ORDER — BENZONATATE 100 MG PO CAPS: 100.0000 mg | ORAL_CAPSULE | Freq: Three times a day (TID) | ORAL | 0 refills | Status: AC

## 2023-09-10 NOTE — ED Triage Notes (Signed)
 SORE THROAT CONGESTION, COUGHING UP YELLOW PHLEGM. X 5 DAYS  SOME DIARRHEA

## 2023-09-10 NOTE — Discharge Instructions (Addendum)
 Your viral swab was negative.  Like we discussed, there are many viruses that we do not test for.  You likely have rhino enterovirus or the common cold.  Your chest x-ray was negative for pneumonia.  You can take Tessalon  Perles for cough.  Your symptoms should begin to improve over the next 1 week.  Follow-up with your PCP.

## 2023-09-10 NOTE — ED Provider Notes (Signed)
 [place] EMERGENCY DEPARTMENT AT [facility] Provider Note   CSN: [id number] Arrival date & time: 09/10/23  2019     History  Chief Complaint  Patient presents with   Sore Throat    Jenny Wagner is a 43 y.o. female.  This is a 43 year old female who is here today for 5 days of cough.  Patient says that she has had some congestion, phlegm and a sore throat.  She has not had a fever.   Sore Throat       Home Medications Prior to Admission medications   Medication Sig Start Date End Date Taking? Authorizing Provider  benzonatate  (TESSALON ) 100 MG capsule Take 1 capsule (100 mg total) by mouth every 8 (eight) hours. 09/10/23  Yes Afton Horse T, DO  albuterol  (VENTOLIN  HFA) 108 (90 Base) MCG/ACT inhaler Inhale 2 puffs into the lungs every 4 (four) hours as needed for wheezing or shortness of breath. Patient not taking: Reported on 06/07/2023 05/28/23   Alissa April, MD  ferrous sulfate  325 (65 FE) MG tablet Take 1 tablet (325 mg total) by mouth daily with breakfast. Patient not taking: Reported on 06/07/2023 03/25/21   Ervin, Michael L, MD  mupirocin  ointment (BACTROBAN ) 2 % Apply 1 Application topically 2 (two) times daily. Patient not taking: Reported on 06/07/2023 05/17/23   Adelia Homestead, MD  predniSONE  (DELTASONE ) 50 MG tablet Take 1 tablet (50 mg total) by mouth daily. Patient not taking: Reported on 06/07/2023 05/28/23   Alissa April, MD  valsartan -hydrochlorothiazide  (DIOVAN -HCT) 80-12.5 MG tablet TAKE 1 TABLET BY MOUTH DAILY 04/15/23   Sagardia, Miguel Jose, MD      Allergies    Patient has no known allergies.    Review of Systems   Review of Systems  Physical Exam Updated Vital Signs BP (!) 160/90   Pulse 74   Temp 99.5 F (37.5 C)   Resp 20   LMP 08/18/2023   SpO2 99%  Physical Exam Vitals reviewed.  HENT:     Mouth/Throat:     Tonsils: No tonsillar exudate.  Eyes:     Conjunctiva/sclera: Conjunctivae normal.  Cardiovascular:     Rate  and Rhythm: Normal rate.  Pulmonary:     Effort: Pulmonary effort is normal. No respiratory distress.     Breath sounds: Normal breath sounds. No wheezing or rales.  Neurological:     Mental Status: She is alert.     ED Results / Procedures / Treatments   Labs (all labs ordered are listed, but only abnormal results are displayed) Labs Reviewed  RESP PANEL BY RT-PCR (RSV, FLU A&B, COVID)  [id number]    EKG None  Radiology No results found.  Procedures Procedures    Medications Ordered in ED Medications - No data to display  ED Course/ Medical Decision Making/ A&P                                 Medical Decision Making 43 year old female here today for cough and sore throat.  Differential diagnoses include URI, less likely pneumonia.  Plan # on exam, patient's oropharynx is normal in appearance.  Likely strep pharyngitis, no evidence of deep space infection in the neck.  No RPA.  Patient with clear lung sounds.  Advised patient that x-ray likely negative, however she preferred to have x-ray performed.  X-ray ordered.  No negation for labs as it would not change management.  Amount and/or  Complexity of Data Reviewed Radiology: ordered.           Final Clinical Impression(s) / ED Diagnoses Final diagnoses:  Acute cough  Viral upper respiratory tract infection    Rx / DC Orders ED Discharge Orders          Ordered    benzonatate  (TESSALON ) 100 MG capsule  Every 8 hours        09/10/23 2155              Afton Horse T, DO 09/10/23 2155

## 2023-09-11 NOTE — ED Notes (Signed)
 RN reviewed discharge instructions with pt. Pt verbalized understanding and had no further questions. VSS upon discharge.

## 2023-10-11 ENCOUNTER — Other Ambulatory Visit: Payer: Self-pay | Admitting: Radiology

## 2023-10-11 ENCOUNTER — Telehealth: Payer: Self-pay | Admitting: Emergency Medicine

## 2023-10-11 NOTE — Telephone Encounter (Signed)
 Copied from CRM [id number]. Topic: Appointments - Appointment Info/Confirmation >> Oct 11, 2023 12:03 PM Tiffany S wrote: Patient/patient representative is calling for information regarding an appointment.   Patient stated Dr Lydia Sams office does not have referral please send referral

## 2023-10-31 NOTE — Progress Notes (Signed)
 Office Visit Note  Patient: Jenny Wagner             Date of Birth: Oct 18, 1980           MRN: [id number]             PCP: Purcell Emil Schanz, MD Referring: Purcell Emil Schanz, * Visit Date: 11/01/2023 Occupation: Print production planner  Subjective:  New Patient (Initial Visit) (Patient states she has joint pain mostly in her knees, ankles, wrists, sometimes her fingers. Patient states she does have lupus. Patient states her right eye sometimes is hard to focus and happens from time to time. )   Discussed the use of AI scribe software for clinical note transcription with the patient, who gave verbal consent to proceed.  History of Present Illness   Jenny Wagner is a 43 year old female with lupus who presents with joint pain and skin rashes. She was referred by Dr. Sagardia for evaluation due to positive ANA test and elevated sedimentation rate.  She experiences joint pain, particularly in her knees and ankles, with morning stiffness. The pain is strong and persistent, accompanied by tingling in her arms, hands, and feet. She describes a sensation of pressure in her lower legs, painful to touch, mostly occurring towards the end of the day for the past two weeks.  She developed a rash all over her body, coinciding with joint pain, possibly triggered by resuming vitamin supplements. A stomach cleansing with the same supplements exacerbated the rash and symptoms, leading her to discontinue all supplements except for her high blood pressure medication.  She experiences headaches and has difficulty swallowing both liquids and solids, leading to coughing. She visited the emergency room for chest pain but does not recall receiving a specific diagnosis. The chest pain is less frequent, but she continues to experience dryness and tingling in her extremities.  She reports scalp issues, including dryness and hair loss, resembling dandruff. She has tried different shampoos, finding some relief with her  current choice. She has noticed skin discoloration, with white spots on her arms, legs, and torso. No family history of lupus or autoimmune diseases.  Her menstrual periods have become irregular over the past two years, with increased heaviness and frequency. No history of PCOS or specific joint injuries. She is not currently using any over-the-counter medications for her symptoms, except for occasional Tylenol  for severe headaches.       Labs reviewed 06/2023 ANA 1:40 speckled RF neg CCP neg ESR 23 CRP neg  Activities of Daily Living:  Patient reports morning stiffness for less than 5 minutes.   Patient Reports nocturnal pain.  Difficulty dressing/grooming: Denies Difficulty climbing stairs: Denies Difficulty getting out of chair: Reports Difficulty using hands for taps, buttons, cutlery, and/or writing: Reports  Review of Systems  Constitutional:  Positive for fatigue.  HENT:  Positive for mouth dryness. Negative for mouth sores.   Eyes:  Negative for dryness.  Respiratory:  Positive for shortness of breath.   Cardiovascular:  Positive for chest pain and palpitations.  Gastrointestinal:  Positive for diarrhea. Negative for blood in stool and constipation.  Endocrine: Positive for increased urination.  Genitourinary:  Positive for involuntary urination.  Musculoskeletal:  Positive for joint pain, gait problem, joint pain, joint swelling, myalgias, muscle weakness, morning stiffness, muscle tenderness and myalgias.  Skin:  Positive for color change, rash, hair loss and sensitivity to sunlight.  Allergic/Immunologic: Negative for susceptible to infections.  Neurological:  Positive for dizziness and headaches.  Hematological:  Positive for swollen glands.  Psychiatric/Behavioral:  Positive for depressed mood and sleep disturbance. The patient is nervous/anxious.     PMFS History:  Patient Active Problem List   Diagnosis Date Noted   Positive ANA (antinuclear antibody)  11/01/2023   Rash and nonspecific skin eruption 06/07/2023   Arthralgia of multiple joints 06/07/2023   Costochondritis 06/02/2023   Nose irritation 05/17/2023   Diverticulosis of colon 03/02/2023   Essential hypertension 01/21/2022   Chronic tension-type headache, not intractable 01/21/2022   Fibroid 04/10/2021   Menorrhagia with regular cycle 03/19/2021   Mild reactive airways disease 06/04/2017    Past Medical History:  Diagnosis Date   Depression    Hypertension    Lupus (systemic lupus erythematosus) (HCC)     Family History  Problem Relation Age of Onset   Pancreatic cancer Mother    Hypertension Other    Diabetes Other    Colon cancer Neg Hx    Esophageal cancer Neg Hx    Liver cancer Neg Hx    Rectal cancer Neg Hx    Stomach cancer Neg Hx    Past Surgical History:  Procedure Laterality Date   CHOLECYSTECTOMY  2007   Social History   Social History Narrative   Not on file   Immunization History  Administered Date(s) Administered   Pneumococcal Polysaccharide-23 07/12/2018     Objective: Vital Signs: BP 121/78 (BP Location: Right Arm, Patient Position: Sitting, Cuff Size: Normal)   Pulse 77   Resp 14   Ht 5' 1 (1.549 m)   Wt 156 lb (70.8 kg)   LMP 10/18/2023   BMI 29.48 kg/m    Physical Exam Eyes:     Conjunctiva/sclera: Conjunctivae normal.  Cardiovascular:     Rate and Rhythm: Normal rate and regular rhythm.  Pulmonary:     Effort: Pulmonary effort is normal.     Breath sounds: Normal breath sounds.  Musculoskeletal:     Right lower leg: No edema.     Left lower leg: No edema.  Lymphadenopathy:     Cervical: No cervical adenopathy.  Skin:    General: Skin is warm and dry.     Comments: Superficial venous varicosities in lower legs No pitting edema Normal-appearing nailfold capillaroscopy  Neurological:     Mental Status: She is alert.  Psychiatric:        Mood and Affect: Mood normal.      Musculoskeletal Exam:  Shoulders full  ROM no tenderness or swelling Elbows full ROM no tenderness or swelling Wrists full ROM no tenderness or swelling Fingers full ROM no tenderness or swelling Tenderness to pressure in the low back both midline and paraspinal muscles Lateral hip tenderness with some pain radiation down the upper leg Knees full ROM no tenderness or swelling Ankles full ROM no tenderness or swelling     Investigation: No additional findings.  Imaging: No results found.  Recent Labs: Lab Results  Component Value Date   WBC 4.8 05/27/2023   HGB 12.6 05/27/2023   PLT 248 05/27/2023   NA 137 05/27/2023   K 3.7 05/27/2023   CL 103 05/27/2023   CO2 24 05/27/2023   GLUCOSE 87 05/27/2023   BUN 19 05/27/2023   CREATININE 0.80 05/27/2023   BILITOT 0.3 05/17/2023   ALKPHOS 54 05/17/2023   AST 15 05/17/2023   ALT 19 05/17/2023   PROT 7.2 05/17/2023   ALBUMIN 4.2 05/17/2023   CALCIUM 9.1 05/27/2023   GFRAA >60 07/06/2015  Speciality Comments: No specialty comments available.  Procedures:  No procedures performed Allergies: Patient has no known allergies.   Assessment / Plan:     Visit Diagnoses: Positive ANA (antinuclear antibody) - Plan: Sedimentation rate, Anti-DNA antibody, double-stranded, Sjogrens syndrome-A extractable nuclear antibody, RNP Antibody, Anti-Smith antibody Concern for lupus with joint pain, rashes, morning stiffness, fatigue, tingling in extremities. Positive ANA, elevated inflammatory markers.  I cannot really confirm any active inflammatory disease from the current history and exam findings with no peripheral synovitis dactylitis or significant rashes at the moment.  Her ANA was positive at a very borderline titer 1: 40 which may often be incidental - Order specific antibody tests: double-stranded DNA, Smith, SSA, RNP. - Recheck sedimentation rate. - Provided information on supplements: turmeric, omega-3, tart cherry, ginger root. - Discussed potential hydroxychloroquine  use if lupus confirmed and symptoms persist. - Consider NSAIDs or steroids for short-term relief if needed or if symptoms persistent despite negative workup.  Costochondritis Chest pain diagnosed as costochondritis, musculoskeletal origin, no cardiac or gastrointestinal involvement.  Discussed possible association with inflammatory joint disease can also involve the chest wall.  Nonreproducible on exam today.     Orders: Orders Placed This Encounter  Procedures   Sedimentation rate   Anti-DNA antibody, double-stranded   Sjogrens syndrome-A extractable nuclear antibody   RNP Antibody   Anti-Smith antibody   No orders of the defined types were placed in this encounter.    Follow-Up Instructions: Return if symptoms worsen or fail to improve.   Lonni LELON Ester, MD  Note - This record has been created using [employer].  Chart creation errors have been sought, but may not always  have been located. Such creation errors do not reflect on  the standard of medical care.

## 2023-11-01 ENCOUNTER — Encounter: Payer: Self-pay | Admitting: Internal Medicine

## 2023-11-01 ENCOUNTER — Ambulatory Visit: Payer: Self-pay | Attending: Internal Medicine | Admitting: Internal Medicine

## 2023-11-01 VITALS — BP 121/78 | HR 77 | Resp 14 | Ht 61.0 in | Wt 156.0 lb

## 2023-11-01 DIAGNOSIS — R768 Other specified abnormal immunological findings in serum: Secondary | ICD-10-CM | POA: Insufficient documentation

## 2023-11-01 DIAGNOSIS — D219 Benign neoplasm of connective and other soft tissue, unspecified: Secondary | ICD-10-CM

## 2023-11-01 DIAGNOSIS — N920 Excessive and frequent menstruation with regular cycle: Secondary | ICD-10-CM

## 2023-11-01 HISTORY — DX: Other specified abnormal immunological findings in serum: R76.8

## 2023-11-02 LAB — ANTI-DNA ANTIBODY, DOUBLE-STRANDED: ds DNA Ab: <1 [IU]/mL

## 2023-11-02 LAB — SJOGRENS SYNDROME-A EXTRACTABLE NUCLEAR ANTIBODY: SSA (Ro) (ENA) Antibody, IgG: <1 AI

## 2023-11-02 LAB — SEDIMENTATION RATE: Sed Rate: 6 mm/h (ref 0–20)

## 2023-11-02 LAB — RNP ANTIBODY: Ribonucleic Protein(ENA) Antibody, IgG: <1 AI

## 2023-11-02 LAB — ANTI-SMITH ANTIBODY: ENA SM Ab Ser-aCnc: <1 AI

## 2023-11-09 ENCOUNTER — Telehealth: Payer: Self-pay | Admitting: *Deleted

## 2023-11-09 NOTE — Telephone Encounter (Signed)
 Patient contacted the office requesting her lab results and what her follow up will be. Please review and advise.

## 2024-01-10 ENCOUNTER — Ambulatory Visit: Payer: Self-pay | Admitting: Internal Medicine

## 2024-01-10 NOTE — Progress Notes (Signed)
 Lab results all negative for markers of SLE and previous elevated sed rate also improved to normal. We can follow up as planned to recheck symptoms and possibly labs, but if she is feeling well we could just follow up as needed.

## 2024-01-24 ENCOUNTER — Ambulatory Visit: Payer: Self-pay | Attending: Internal Medicine | Admitting: Internal Medicine

## 2024-01-24 ENCOUNTER — Encounter: Payer: Self-pay | Admitting: Internal Medicine

## 2024-01-24 VITALS — BP 121/70 | HR 64 | Temp 98.5°F | Resp 14 | Ht 61.0 in | Wt 159.2 lb

## 2024-01-24 DIAGNOSIS — R109 Unspecified abdominal pain: Secondary | ICD-10-CM

## 2024-01-24 MED ORDER — CYCLOBENZAPRINE HCL 10 MG PO TABS: 10.0000 mg | ORAL_TABLET | Freq: Every day | ORAL | 0 refills | Status: AC
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
## 2024-03-22 ENCOUNTER — Other Ambulatory Visit: Payer: Self-pay

## 2024-03-22 ENCOUNTER — Emergency Department (HOSPITAL_COMMUNITY): Payer: Self-pay

## 2024-03-22 ENCOUNTER — Emergency Department (HOSPITAL_COMMUNITY)
Admission: EM | Admit: 2024-03-22 | Discharge: 2024-03-22 | Disposition: A | Discharge status: Home / Self Care | Payer: Self-pay | Attending: Emergency Medicine | Admitting: Emergency Medicine

## 2024-03-22 ENCOUNTER — Encounter (HOSPITAL_COMMUNITY): Payer: Self-pay

## 2024-03-22 DIAGNOSIS — D219 Benign neoplasm of connective and other soft tissue, unspecified: Secondary | ICD-10-CM

## 2024-03-22 DIAGNOSIS — I10 Essential (primary) hypertension: Secondary | ICD-10-CM | POA: Insufficient documentation

## 2024-03-22 DIAGNOSIS — D649 Anemia, unspecified: Secondary | ICD-10-CM | POA: Insufficient documentation

## 2024-03-22 DIAGNOSIS — D259 Leiomyoma of uterus, unspecified: Secondary | ICD-10-CM | POA: Insufficient documentation

## 2024-03-22 DIAGNOSIS — K5792 Diverticulitis of intestine, part unspecified, without perforation or abscess without bleeding: Secondary | ICD-10-CM

## 2024-03-22 DIAGNOSIS — K5732 Diverticulitis of large intestine without perforation or abscess without bleeding: Secondary | ICD-10-CM | POA: Insufficient documentation

## 2024-03-22 LAB — URINALYSIS, W/ REFLEX TO CULTURE (INFECTION SUSPECTED)
Bilirubin Urine: NEGATIVE
Glucose, UA: NEGATIVE mg/dL
Hgb urine dipstick: NEGATIVE
Ketones, ur: NEGATIVE mg/dL
Leukocytes,Ua: NEGATIVE
Nitrite: NEGATIVE
Protein, ur: NEGATIVE mg/dL
Specific Gravity, Urine: 1.024 (ref 1.005–1.030)
pH: 5 (ref 5.0–8.0)

## 2024-03-22 LAB — CBC WITH DIFFERENTIAL/PLATELET
Abs Immature Granulocytes: 0.02 K/uL (ref 0.00–0.07)
Basophils Absolute: 0 K/uL (ref 0.0–0.1)
Basophils Relative: 0 %
Eosinophils Absolute: 0 K/uL (ref 0.0–0.5)
Eosinophils Relative: 1 %
HCT: 36.4 % (ref 36.0–46.0)
Hemoglobin: 11 g/dL — ABNORMAL LOW (ref 12.0–15.0)
Immature Granulocytes: 0 %
Lymphocytes Relative: 27 %
Lymphs Abs: 2.1 K/uL (ref 0.7–4.0)
MCH: 24 pg — ABNORMAL LOW (ref 26.0–34.0)
MCHC: 30.2 g/dL (ref 30.0–36.0)
MCV: 79.3 fL — ABNORMAL LOW (ref 80.0–100.0)
Monocytes Absolute: 0.5 K/uL (ref 0.1–1.0)
Monocytes Relative: 6 %
Neutro Abs: 5.1 K/uL (ref 1.7–7.7)
Neutrophils Relative %: 66 %
Platelets: 286 K/uL (ref 150–400)
RBC: 4.59 MIL/uL (ref 3.87–5.11)
RDW: 16.9 % — ABNORMAL HIGH (ref 11.5–15.5)
WBC: 7.8 K/uL (ref 4.0–10.5)
nRBC: 0 % (ref 0.0–0.2)

## 2024-03-22 LAB — COMPREHENSIVE METABOLIC PANEL WITH GFR
ALT: 35 U/L (ref 0–44)
AST: 24 U/L (ref 15–41)
Albumin: 4.3 g/dL (ref 3.5–5.0)
Alkaline Phosphatase: 85 U/L (ref 38–126)
Anion gap: 10 (ref 5–15)
BUN: 10 mg/dL (ref 6–20)
CO2: 24 mmol/L (ref 22–32)
Calcium: 9.2 mg/dL (ref 8.9–10.3)
Chloride: 103 mmol/L (ref 98–111)
Creatinine, Ser: 0.72 mg/dL (ref 0.44–1.00)
GFR, Estimated: >60 mL/min (ref >60)
Glucose, Bld: 106 mg/dL — ABNORMAL HIGH (ref 70–99)
Potassium: 4.4 mmol/L (ref 3.5–5.1)
Sodium: 137 mmol/L (ref 135–145)
Total Bilirubin: 0.4 mg/dL (ref 0.0–1.2)
Total Protein: 7.8 g/dL (ref 6.5–8.1)

## 2024-03-22 LAB — LIPASE, BLOOD: Lipase: 37 U/L (ref 11–51)

## 2024-03-22 LAB — PREGNANCY, URINE: Preg Test, Ur: NEGATIVE

## 2024-03-22 MED ORDER — AMOXICILLIN-POT CLAVULANATE 875-125 MG PO TABS: 1.0000 | ORAL_TABLET | Freq: Two times a day (BID) | ORAL | 0 refills | Status: AC

## 2024-03-22 MED ORDER — DICYCLOMINE HCL 20 MG PO TABS: 20.0000 mg | ORAL_TABLET | Freq: Two times a day (BID) | ORAL | 0 refills | Status: AC | PRN

## 2024-03-22 MED ORDER — ONDANSETRON 4 MG PO TBDP: 4.0000 mg | ORAL_TABLET | Freq: Three times a day (TID) | ORAL | 0 refills | Status: DC | PRN

## 2024-03-22 MED ORDER — LACTATED RINGERS IV BOLUS
1000.0000 mL | Freq: Once | INTRAVENOUS | Status: AC
  Administered 2024-03-22: 1000 mL via INTRAVENOUS

## 2024-03-22 MED ORDER — SODIUM CHLORIDE (PF) 0.9 % IJ SOLN
INTRAMUSCULAR | Status: AC
  Filled 2024-03-22: qty 50

## 2024-03-22 MED ORDER — IOHEXOL 300 MG/ML  SOLN
100.0000 mL | Freq: Once | INTRAMUSCULAR | Status: AC | PRN
  Administered 2024-03-22: 100 mL via INTRAVENOUS

## 2024-03-22 NOTE — ED Provider Notes (Signed)
 [place]  EMERGENCY DEPARTMENT AT [facility] Provider Note   CSN: [id number] Arrival date & time: 03/22/24  9140     Patient presents with: Abdominal Pain, Flank Pain, and Emesis   Jenny Wagner is a 43 y.o. female.   HPI     43 year old female with a history of hypertension, lupus who presents with concern for abdominal pain.    2 years intermittent RUQ pain, constant pain 3/10 but 3 big episodes of pain over the last 2 years The last 2 weeks, pain worse after eating Soreness, stronger pain now running to other side of abdomen and back, feels like someone kicked her, aching pain Nausea this AM, no vomiting Chills last night , no fevers  No diarrhea but has had loose stool No black or bloody stools No urinary symptoms No vaginal bleeding or discharge  Had gallbladder removed in 2004   Past Medical History:  Diagnosis Date   Depression    Hypertension    Lupus (systemic lupus erythematosus) (HCC)    Positive ANA (antinuclear antibody) 11/01/2023     Prior to Admission medications   Medication Sig Start Date End Date Taking? Authorizing Provider  amoxicillin -clavulanate (AUGMENTIN ) 875-125 MG tablet Take 1 tablet by mouth every 12 (twelve) hours. 03/22/24  Yes Dreama Longs, MD  dicyclomine  (BENTYL ) 20 MG tablet Take 1 tablet (20 mg total) by mouth 2 (two) times daily as needed for spasms. 03/22/24  Yes Dreama Longs, MD  ondansetron  (ZOFRAN -ODT) 4 MG disintegrating tablet Take 1 tablet (4 mg total) by mouth every 8 (eight) hours as needed for nausea or vomiting. 03/22/24  Yes Dreama Longs, MD  albuterol  (VENTOLIN  HFA) 108 (90 Base) MCG/ACT inhaler Inhale 2 puffs into the lungs every 4 (four) hours as needed for wheezing or shortness of breath. Patient not taking: Reported on 01/24/2024 05/28/23   Raford Lenis, MD  benzonatate  (TESSALON ) 100 MG capsule Take 1 capsule (100 mg total) by mouth every 8 (eight) hours. Patient not taking: Reported on  01/24/2024 09/10/23   Mannie Pac T, DO  cyclobenzaprine  (FLEXERIL ) 10 MG tablet Take 1 tablet (10 mg total) by mouth at bedtime. Or twice daily as needed 01/24/24   Jeannetta Lonni ORN, MD  ferrous sulfate  325 (65 FE) MG tablet Take 1 tablet (325 mg total) by mouth daily with breakfast. Patient not taking: Reported on 01/24/2024 03/25/21   Ervin, Michael L, MD  mupirocin  ointment (BACTROBAN ) 2 % Apply 1 Application topically 2 (two) times daily. Patient not taking: Reported on 01/24/2024 05/17/23   Rollene Almarie LABOR, MD  predniSONE  (DELTASONE ) 50 MG tablet Take 1 tablet (50 mg total) by mouth daily. Patient not taking: Reported on 01/24/2024 05/28/23   Raford Lenis, MD  valsartan -hydrochlorothiazide  (DIOVAN -HCT) 80-12.5 MG tablet TAKE 1 TABLET BY MOUTH DAILY 04/15/23   Sagardia, Miguel Jose, MD    Allergies: Patient has no known allergies.    Review of Systems  Updated Vital Signs BP 131/88 (BP Location: Right Arm)   Pulse 77   Temp 98.2 F (36.8 C)   Resp 18   SpO2 98%   Physical Exam Vitals and nursing note reviewed.  Constitutional:      General: She is not in acute distress.    Appearance: She is well-developed. She is not diaphoretic.  HENT:     Head: Normocephalic and atraumatic.  Eyes:     Conjunctiva/sclera: Conjunctivae normal.  Cardiovascular:     Rate and Rhythm: Normal rate and regular rhythm.  Pulmonary:  Effort: Pulmonary effort is normal. No respiratory distress.  Abdominal:     General: There is no distension.     Palpations: Abdomen is soft.     Tenderness: There is abdominal tenderness in the right upper quadrant. There is right CVA tenderness. There is no guarding.  Musculoskeletal:        General: No tenderness.     Cervical back: Normal range of motion.  Skin:    General: Skin is warm and dry.     Findings: No erythema or rash.  Neurological:     Mental Status: She is alert and oriented to person, place, and time.     (all labs ordered are  listed, but only abnormal results are displayed) Labs Reviewed  CBC WITH DIFFERENTIAL/PLATELET - Abnormal; Notable for the following components:      Result Value   Hemoglobin 11.0 (*)    MCV 79.3 (*)    MCH 24.0 (*)    RDW 16.9 (*)    All other components within normal limits  COMPREHENSIVE METABOLIC PANEL WITH GFR - Abnormal; Notable for the following components:   Glucose, Bld 106 (*)    All other components within normal limits  URINALYSIS, W/ REFLEX TO CULTURE (INFECTION SUSPECTED) - Abnormal; Notable for the following components:   APPearance TURBID (*)    Bacteria, UA MANY (*)    All other components within normal limits  URINE CULTURE  LIPASE, BLOOD  PREGNANCY, URINE    EKG: None  Radiology: CT ABDOMEN PELVIS W CONTRAST Result Date: 03/22/2024 CLINICAL DATA:  Abdominal pain. EXAM: CT ABDOMEN AND PELVIS WITH CONTRAST TECHNIQUE: Multidetector CT imaging of the abdomen and pelvis was performed using the standard protocol following bolus administration of intravenous contrast. RADIATION DOSE REDUCTION: This exam was performed according to the departmental dose-optimization program which includes automated exposure control, adjustment of the mA and/or kV according to patient size and/or use of iterative reconstruction technique. CONTRAST:  OMNIPAQUE  IOHEXOL  300 MG/ML  SOLN COMPARISON:  CT abdomen pelvis dated 05/25/2023. FINDINGS: Lower chest: The visualized lung bases are clear. No intra-abdominal free air or free fluid. Hepatobiliary: The liver is unremarkable. No biliary dilatation. Cholecystectomy. Pancreas: Unremarkable. No pancreatic ductal dilatation or surrounding inflammatory changes. Spleen: Normal in size without focal abnormality. Adrenals/Urinary Tract: The adrenal glands unremarkable. The kidneys, visualized ureters, and urinary bladder appear unremarkable. Stomach/Bowel: Several small scattered colonic diverticula noted. There is mild inflammatory changes adjacent  to the hepatic flexure suspicious for acute diverticulitis. No abscess or perforation. There is no bowel obstruction. No evidence of acute appendicitis. Vascular/Lymphatic: Mild aortoiliac atherosclerotic disease. There is compression of the right external iliac vein between the uterus and ileus psoas muscle. The IVC is unremarkable. No portal gas. There is no adenopathy. Reproductive: An 8.5 x 8.5 cm left uterine body fibroid with mass effect and displacement of the uterus to the right of the midline. Other: Small fat containing umbilical hernia. Musculoskeletal: No acute or significant osseous findings. IMPRESSION: 1. Mild acute diverticulitis of the hepatic flexure. No abscess or perforation. 2. Large uterine fibroid. 3.  Aortic Atherosclerosis (ICD10-I70.0). Electronically Signed   By: Vanetta Chou M.D.   On: 03/22/2024 14:03     Procedures   Medications Ordered in the ED  lactated ringers bolus 1,000 mL (0 mLs Intravenous Stopped 03/22/24 1443)  iohexol  (OMNIPAQUE ) 300 MG/ML solution 100 mL (100 mLs Intravenous Contrast Given 03/22/24 1336)  43 year old female with a history of hypertension, lupus who presents with concern for abdominal pain.  DDx includes appendicitis, pancreatitis, choledocolithiasis, SBO, pyelonephritis, nephrolithiasis, diverticulitis, PID, ovarian torsion, ectopic pregnancy, and tuboovarian abscess   Labs completed and evaluated by me show no pancreatitis, no transaminitis, no electrolyte abnormalities, no leukocytosis, mild anemia.  Pregnancy test negative. UA without leukocytes, bacteria present possible contamination.   CT shows mild acute diverticulitis at the hepatic flexure without abscess or perforation.  She has a large uterine fibroid.  Suspect symptoms today secondary to diverticulitis. Given duration, will treat with augmentin . Given rx for bentyl , zofran . Recommend PCP follow up.  Patient discharged in stable  condition with understanding of reasons to return.      Final diagnoses:  Diverticulitis  Fibroid    ED Discharge Orders          Ordered    amoxicillin -clavulanate (AUGMENTIN ) 875-125 MG tablet  Every 12 hours        03/22/24 1442    ondansetron  (ZOFRAN -ODT) 4 MG disintegrating tablet  Every 8 hours PRN        03/22/24 1442    dicyclomine  (BENTYL ) 20 MG tablet  2 times daily PRN        03/22/24 1442               Dreama Longs, MD 03/22/24 2104

## 2024-03-22 NOTE — ED Triage Notes (Signed)
 Pt arrives via POV. Pt reports intermittent abdominal pain/flank pain for the past two years. States symptoms worsened over the past 2 weeks. Reports some nausea this morning. Pt is AxOx4.

## 2024-03-23 LAB — URINE CULTURE: Culture: <10000 — AB

## 2024-04-05 ENCOUNTER — Emergency Department (HOSPITAL_COMMUNITY)
Admission: EM | Admit: 2024-04-05 | Discharge: 2024-04-05 | Disposition: A | Discharge status: Home / Self Care | Payer: Self-pay | Attending: Emergency Medicine | Admitting: Emergency Medicine

## 2024-04-05 ENCOUNTER — Emergency Department (HOSPITAL_COMMUNITY): Payer: Self-pay

## 2024-04-05 ENCOUNTER — Other Ambulatory Visit: Payer: Self-pay

## 2024-04-05 ENCOUNTER — Encounter (HOSPITAL_COMMUNITY): Payer: Self-pay

## 2024-04-05 DIAGNOSIS — R42 Dizziness and giddiness: Secondary | ICD-10-CM | POA: Insufficient documentation

## 2024-04-05 DIAGNOSIS — R200 Anesthesia of skin: Secondary | ICD-10-CM | POA: Insufficient documentation

## 2024-04-05 LAB — CBC
HCT: 36.6 % (ref 36.0–46.0)
Hemoglobin: 11.2 g/dL — ABNORMAL LOW (ref 12.0–15.0)
MCH: 23.6 pg — ABNORMAL LOW (ref 26.0–34.0)
MCHC: 30.6 g/dL (ref 30.0–36.0)
MCV: 77.2 fL — ABNORMAL LOW (ref 80.0–100.0)
Platelets: 324 K/uL (ref 150–400)
RBC: 4.74 MIL/uL (ref 3.87–5.11)
RDW: 16.7 % — ABNORMAL HIGH (ref 11.5–15.5)
WBC: 4.3 K/uL (ref 4.0–10.5)
nRBC: 0 % (ref 0.0–0.2)

## 2024-04-05 LAB — COMPREHENSIVE METABOLIC PANEL WITH GFR
ALT: 21 U/L (ref 0–44)
AST: 17 U/L (ref 15–41)
Albumin: 4.4 g/dL (ref 3.5–5.0)
Alkaline Phosphatase: 67 U/L (ref 38–126)
Anion gap: 11 (ref 5–15)
BUN: 15 mg/dL (ref 6–20)
CO2: 22 mmol/L (ref 22–32)
Calcium: 9 mg/dL (ref 8.9–10.3)
Chloride: 103 mmol/L (ref 98–111)
Creatinine, Ser: 0.64 mg/dL (ref 0.44–1.00)
GFR, Estimated: >60 mL/min (ref >60)
Glucose, Bld: 109 mg/dL — ABNORMAL HIGH (ref 70–99)
Potassium: 4 mmol/L (ref 3.5–5.1)
Sodium: 136 mmol/L (ref 135–145)
Total Bilirubin: 0.3 mg/dL (ref 0.0–1.2)
Total Protein: 7.9 g/dL (ref 6.5–8.1)

## 2024-04-05 LAB — CBG MONITORING, ED: Glucose-Capillary: 99 mg/dL (ref 70–99)

## 2024-04-05 LAB — URINALYSIS, ROUTINE W REFLEX MICROSCOPIC
Bacteria, UA: NONE SEEN
Bilirubin Urine: NEGATIVE
Glucose, UA: NEGATIVE mg/dL
Ketones, ur: NEGATIVE mg/dL
Leukocytes,Ua: NEGATIVE
Nitrite: NEGATIVE
Protein, ur: NEGATIVE mg/dL
Specific Gravity, Urine: 1.014 (ref 1.005–1.030)
pH: 6 (ref 5.0–8.0)

## 2024-04-05 LAB — HCG, SERUM, QUALITATIVE: Preg, Serum: NEGATIVE

## 2024-04-05 MED ORDER — MECLIZINE HCL 25 MG PO TABS: 25.0000 mg | ORAL_TABLET | Freq: Two times a day (BID) | ORAL | 0 refills | Status: AC | PRN

## 2024-04-05 MED ORDER — GADOBUTROL 1 MMOL/ML IV SOLN
7.0000 mL | Freq: Once | INTRAVENOUS | Status: AC | PRN
  Administered 2024-04-05: 7 mL via INTRAVENOUS

## 2024-04-05 MED ORDER — ONDANSETRON HCL 4 MG/2ML IJ SOLN
4.0000 mg | Freq: Once | INTRAMUSCULAR | Status: AC
  Administered 2024-04-05: 4 mg via INTRAVENOUS
  Filled 2024-04-05: qty 2

## 2024-04-05 MED ORDER — MECLIZINE HCL 25 MG PO TABS
25.0000 mg | ORAL_TABLET | Freq: Once | ORAL | Status: AC
  Administered 2024-04-05: 25 mg via ORAL
  Filled 2024-04-05: qty 1

## 2024-04-05 MED ORDER — ONDANSETRON 4 MG PO TBDP: ORAL_TABLET | ORAL | 0 refills | Status: AC

## 2024-04-05 NOTE — ED Notes (Signed)
 Patient d/c with home care instructions. IV discontinued. Husband at bedside

## 2024-04-05 NOTE — ED Provider Notes (Signed)
 [place] EMERGENCY DEPARTMENT AT [facility] Provider Note   CSN: [id number] Arrival date & time: 04/05/24  1220     Patient presents with: Dizziness and Numbness   Jenny Wagner is a 43 y.o. female here presenting with dizziness.  Patient states that yesterday she started having vertigo.  She states that she feels that the room was spinning.  Today she developed some numbness around her lips and bilateral arms.  Patient denies any weakness of her hands.  Patient denies any leg numbness or weakness.  Patient does not have a history of stroke in the past or history of multiple sclerosis.  No family history of multiple sclerosis either.   The history is provided by the patient.       Prior to Admission medications   Medication Sig Start Date End Date Taking? Authorizing Provider  albuterol  (VENTOLIN  HFA) 108 (90 Base) MCG/ACT inhaler Inhale 2 puffs into the lungs every 4 (four) hours as needed for wheezing or shortness of breath. Patient not taking: Reported on 01/24/2024 05/28/23   Raford Lenis, MD  amoxicillin -clavulanate (AUGMENTIN ) 875-125 MG tablet Take 1 tablet by mouth every 12 (twelve) hours. 03/22/24   Dreama Longs, MD  benzonatate  (TESSALON ) 100 MG capsule Take 1 capsule (100 mg total) by mouth every 8 (eight) hours. Patient not taking: Reported on 01/24/2024 09/10/23   Mannie Pac T, DO  cyclobenzaprine  (FLEXERIL ) 10 MG tablet Take 1 tablet (10 mg total) by mouth at bedtime. Or twice daily as needed 01/24/24   Jeannetta Lonni ORN, MD  dicyclomine  (BENTYL ) 20 MG tablet Take 1 tablet (20 mg total) by mouth 2 (two) times daily as needed for spasms. 03/22/24   Dreama Longs, MD  ferrous sulfate  325 (65 FE) MG tablet Take 1 tablet (325 mg total) by mouth daily with breakfast. Patient not taking: Reported on 01/24/2024 03/25/21   Ervin, Michael L, MD  mupirocin  ointment (BACTROBAN ) 2 % Apply 1 Application topically 2 (two) times daily. Patient not taking: Reported  on 01/24/2024 05/17/23   Rollene Almarie LABOR, MD  ondansetron  (ZOFRAN -ODT) 4 MG disintegrating tablet Take 1 tablet (4 mg total) by mouth every 8 (eight) hours as needed for nausea or vomiting. 03/22/24   Dreama Longs, MD  predniSONE  (DELTASONE ) 50 MG tablet Take 1 tablet (50 mg total) by mouth daily. Patient not taking: Reported on 01/24/2024 05/28/23   Raford Lenis, MD  valsartan -hydrochlorothiazide  (DIOVAN -HCT) 80-12.5 MG tablet TAKE 1 TABLET BY MOUTH DAILY 04/15/23   Sagardia, Miguel Jose, MD    Allergies: Patient has no known allergies.    Review of Systems  Neurological:  Positive for dizziness.  All other systems reviewed and are negative.   Updated Vital Signs BP 135/80 (BP Location: Right Arm)   Pulse 71   Temp (!) 97.5 F (36.4 C) (Oral)   Resp 16   Ht 5' 1 (1.549 m)   Wt 72.2 kg   SpO2 98%   BMI 30.08 kg/m   Physical Exam Vitals and nursing note reviewed.  Constitutional:      Appearance: Normal appearance.  HENT:     Head: Normocephalic.     Nose: Nose normal.     Mouth/Throat:     Mouth: Mucous membranes are moist.  Eyes:     Pupils: Pupils are equal, round, and reactive to light.     Comments: Some horizontal nystagmus to the left.  No obvious rotatory or vertical nystagmus  Cardiovascular:     Rate and Rhythm: Normal  rate and regular rhythm.     Pulses: Normal pulses.     Heart sounds: Normal heart sounds.  Pulmonary:     Effort: Pulmonary effort is normal.     Breath sounds: Normal breath sounds.  Abdominal:     General: Abdomen is flat.     Palpations: Abdomen is soft.  Musculoskeletal:        General: Normal range of motion.     Cervical back: Normal range of motion and neck supple.  Skin:    General: Skin is warm.     Capillary Refill: Capillary refill takes less than 2 seconds.  Neurological:     Mental Status: She is alert.     Comments: Some decrease sensation around the mouth.  No obvious facial droop.  Patient has normal sensation  bilateral arms and legs and normal strength bilateral arms and legs.  Psychiatric:        Mood and Affect: Mood normal.        Behavior: Behavior normal.     (all labs ordered are listed, but only abnormal results are displayed) Labs Reviewed  COMPREHENSIVE METABOLIC PANEL WITH GFR - Abnormal; Notable for the following components:      Result Value   Glucose, Bld 109 (*)    All other components within normal limits  CBC - Abnormal; Notable for the following components:   Hemoglobin 11.2 (*)    MCV 77.2 (*)    MCH 23.6 (*)    RDW 16.7 (*)    All other components within normal limits  URINALYSIS, ROUTINE W REFLEX MICROSCOPIC - Abnormal; Notable for the following components:   Color, Urine STRAW (*)    Hgb urine dipstick MODERATE (*)    All other components within normal limits  HCG, SERUM, QUALITATIVE  CBG MONITORING, ED  CBG MONITORING, ED    EKG: EKG Interpretation Date/Time:  Wednesday April 05 2024 12:32:46 EST Ventricular Rate:  69 PR Interval:  153 QRS Duration:  96 QT Interval:  404 QTC Calculation: 433 R Axis:   42  Text Interpretation: Sinus rhythm No significant change since last tracing Confirmed by Patt Alm DEL [phone]) on 04/05/2024 3:57:46 PM  Radiology: No results found.   Procedures   Medications Ordered in the ED  meclizine (ANTIVERT) tablet 25 mg (has no administration in time range)                                    Medical Decision Making Jenny Wagner is a 43 y.o. female here presenting with dizziness and numbness.  Consider peripheral vertigo versus multiple sclerosis.  Low suspicion for stroke.  Will get MRI brain and cervical spine with and without contrast and labs.  Will give meclizine and reassess  7:56 PM I reviewed patient's labs and they were unremarkable.  MRI brain and cervical spine showed nonspecific microhemorrhage and no stroke or multiple sclerosis.  Patient had 1 episode of vomiting that resolved with Zofran  and  meclizine.  She likely has peripheral vertigo.  Stable for discharge  Problems Addressed: Vertigo: acute illness or injury  Amount and/or Complexity of Data Reviewed Labs: ordered. Decision-making details documented in ED Course. Radiology: ordered.  Risk Prescription drug management.    Final diagnoses:  None    ED Discharge Orders     None          Patt Alm Macho, MD 04/05/24 [phone]

## 2024-04-05 NOTE — Discharge Instructions (Signed)
 As we discussed, your MRI did not show any stroke or multiple sclerosis  You likely have bad vertigo  I have prescribed meclizine 25 mg twice daily as needed  You can also take Zofran  4 mg as needed for his nausea or vomiting  Please avoid standing up too quickly or turning her head too quickly  Please follow-up with your primary care doctor  Return to ER if you have worse dizziness or numbness or weakness

## 2024-04-05 NOTE — ED Notes (Signed)
 Pt taken to MRI

## 2024-04-05 NOTE — ED Triage Notes (Signed)
 Pt arrived via POV, c/o of dizziness last night & this morning. She then developed numbness & tingling legs and mouth. Pt states she feels very nauseous. States she feel SOB on exertion.

## 2024-04-15 ENCOUNTER — Other Ambulatory Visit: Payer: Self-pay | Admitting: Emergency Medicine

## 2024-04-15 DIAGNOSIS — I10 Essential (primary) hypertension: Secondary | ICD-10-CM
